# Patient Record
Sex: Male | Born: 1970 | Hispanic: No | Marital: Married | Smoking: Current some day smoker
Health system: Southern US, Community
[De-identification: ages and names within clinical notes are randomized; demographics above are authoritative.]

## PROBLEM LIST (undated history)

## (undated) DIAGNOSIS — B2790 Infectious mononucleosis, unspecified without complication: Secondary | ICD-10-CM

## (undated) DIAGNOSIS — T7840XA Allergy, unspecified, initial encounter: Secondary | ICD-10-CM

## (undated) DIAGNOSIS — I471 Supraventricular tachycardia: Secondary | ICD-10-CM

## (undated) DIAGNOSIS — K219 Gastro-esophageal reflux disease without esophagitis: Secondary | ICD-10-CM

## (undated) HISTORY — DX: Infectious mononucleosis, unspecified without complication: B27.90

## (undated) HISTORY — DX: Allergy, unspecified, initial encounter: T78.40XA

## (undated) HISTORY — PX: BACK SURGERY: SHX140

---
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
## 2006-05-16 ENCOUNTER — Ambulatory Visit: Payer: Self-pay | Admitting: Cardiology

## 2006-05-25 ENCOUNTER — Encounter: Payer: Self-pay | Admitting: Internal Medicine

## 2006-05-25 ENCOUNTER — Ambulatory Visit: Payer: Self-pay

## 2006-05-25 DIAGNOSIS — I471 Supraventricular tachycardia, unspecified: Secondary | ICD-10-CM

## 2006-05-25 HISTORY — DX: Supraventricular tachycardia, unspecified: I47.10

## 2006-05-25 HISTORY — DX: Supraventricular tachycardia: I47.1

## 2008-11-05 ENCOUNTER — Encounter: Payer: Self-pay | Admitting: Cardiology

## 2008-11-17 DIAGNOSIS — Z8619 Personal history of other infectious and parasitic diseases: Secondary | ICD-10-CM | POA: Insufficient documentation

## 2008-11-18 ENCOUNTER — Ambulatory Visit: Payer: Self-pay | Admitting: Cardiology

## 2010-08-16 NOTE — Assessment & Plan Note (Signed)
[facility] HEALTHCARE                            CARDIOLOGY OFFICE NOTE   Adam Crawford, Adam Crawford                        MRN:          [id number]  DATE:11/18/2008                            DOB:          03-31-1971    PRIMARY CARE PHYSICIAN:  Roney Jaffe   REASON FOR PRESENTATION:  Evaluate the patient for palpitations.   HISTORY OF PRESENT ILLNESS:  I saw this patient a couple of years ago  for evaluation of tachycardia.  It sounded like he was having  supraventricular arrhythmias.  He learned how to manage this over the  last couple of years.  He will rarely get some sustained  tachyarrhythmias.  However, he will rub his carotids and this will go  away.  He says that however in the past 2 weeks, he has noticed  increased palpitations that are unlike his previous.  These are isolated  beats.  He would feel them particularly when he was physically stressed  or emotionally stressed.  He did wear a Holter monitor.  This  demonstrated occasional premature ventricular contractions.  There were  no sustained dysrhythmias  though he did have some ventricular bigeminy,  0.7% of his total beats were ventricular premature beats.  He said that  since then, he has tried to eliminate some emotional stress.  He has  given some of his physical work away to [employer].  He says with this,  he is much improved.  In the last week, he has had very few  palpitations.  He denies any chest pressure, neck, or arm discomfort.  He denies any shortness of breath, PND or orthopnea.  He did see another  cardiologist in my absence who had suggested an echocardiogram.  However, he had one of these in 2008 that demonstrated no abnormalities.   PAST MEDICAL HISTORY:  Mononucleosis.   ALLERGIES:  None.   MEDICATIONS:  Vitamin D, weekly.   REVIEW OF SYSTEMS:  As stated in the HPI, otherwise negative for all  other systems.   PHYSICAL EXAMINATION:  GENERAL:  The patient is pleasant and  in no  distress.  VITAL SIGNS:  Blood pressure 126/80, heart rate 83 and regular, weight  199 pounds, body mass index 28.  HEENT:  Eyelids unremarkable.  Pupils equal, round, and reactive to  light.  Fundi not visualized.  Oral mucosa unremarkable.  NECK:  No jugular venous distention at 45 degrees, carotid upstroke  brisk and symmetric, no bruits, no thyromegaly.  LYMPHATICS:  No cervical, axillary, or inguinal adenopathy.  LUNGS:  Clear to auscultation bilaterally.  BACK:  No costovertebral angle tenderness.  CHEST:  Unremarkable.  HEART:  PMI not displaced or sustained, S1 and S2 within normal limits,  no S3, no S4, no clicks, no rubs, no murmurs.  ABDOMEN:  Flat, positive bowel sounds normal in frequency and pitch, no  bruits, rebound, guarding or midline pulsatile mass.  No hepatomegaly or splenomegaly.  SKIN:  No rashes, no nodules.  EXTREMITIES:  2+ pulses throughout, no edema, cyanosis, clubbing.  NEURO:  Oriented to person, place, and time.  Cranial  nerves II-XII  grossly intact, motor grossly intact.   ASSESSMENT/PLAN:  1. Premature ventricular contractions.  The patient is noticing these.      They are clearly related to emotional and physical stress.  He has      learnt how to control this.  He says they have been much less      prominent since he noted this association.  At this point, I do not      think further cardiovascular testing is indicated.  I would not      suggest an echocardiogram as there is no evidence that he has had      any structural change in his heart since his last echo 2 years ago.      He does not need a beta-blocker or calcium channel blocker.      However, we could discuss these treatments or antiarrhythmic if he      has any worsening symptoms.  2. Followup.  We will see the patient back as needed.     Rollene Rotunda, MD, [facility]  Electronically Signed    JH/MedQ  DD: 11/18/2008  DT: 11/19/2008  Job #: [id number]   cc:   Helene Kelp, PA

## 2010-08-19 NOTE — Assessment & Plan Note (Signed)
[facility] HEALTHCARE                            CARDIOLOGY OFFICE NOTE   Adam Crawford, Adam Crawford                        MRN:          [id number]  DATE:05/16/2006                            DOB:          1970-12-24    REASON FOR PRESENTATION:  Patient called and got this appointment for  evaluation of palpitations.  He has had these infrequently on and off  for years.  However, he has been having the much more frequently over  about 6 months.  He thinks it is about 6 times.  The last was greater  than 3 weeks ago.  He describes a sudden rapid heart rate.  It has  lasted up to about 30 minutes.  He has noticed in particular when he  bends over to pick something up.  He can lie down and have the symptoms  go away.  However, if he is in a situation where he cannot do it, they  may persist.  He gets a little lightheaded, but has not had any  presyncope or syncope.  He gets a little bit anxious about this.  He  does not describe any chest discomfort, neck discomfort, arm discomfort,  activity-induced nausea, vomiting or excessive diaphoresis.  He has not  been having any PND or orthopnea.  He, otherwise, is a very physically  active guy, and he cannot bring these symptoms on.   PAST MEDICAL HISTORY:  He had a history of mononucleosis, but absolutely  no other medical history.   PAST SURGICAL HISTORY:  None.   ALLERGIES:  NONE.   MEDICATIONS:  None.   SOCIAL HISTORY:  The patient is married.  He has a 53-month-old child.  He works as a Surveyor, minerals.  He rarely smokes cigarettes.  He will drink  alcohol occasionally.   FAMILY HISTORY:  Contributory for his father having a myocardial  infarction at age 16.  They were told this was coronary spasm.  He  denies any family history of sudden cardiac death, syncope or  cardiomyopathy.   REVIEW OF SYSTEMS:  As stated in the HPI, and otherwise negative for  other systems.   PHYSICAL EXAMINATION:  Patient is in no  distress.  Blood pressure 126/82.  Heart rate 63 and regular.  Weight 202 pounds.  HEENT:  Eyes unremarkable.  Pupils equal, round and reactive to light.  Fundi not visualized.  Oral mucosa unremarkable.  NECK:  No jugular venous distention at 45 degrees.  Carotid upstroke and  symmetric.  No bruits.  No thyromegaly.  LYMPHATICS:  No cervical, axillary or inguinal adenopathy.  LUNGS:  Clear to auscultation bilaterally.  BACK:  No costovertebral angle tenderness.  CHEST:  Unremarkable.  HEART:  PMI not displaced or sustained.  S1 and S2 within normal limits.  No S3.  No S4.  No clicks.  No rubs.  No murmurs.  ABDOMEN:  Flat.  Positive bowel sounds.  Normal in frequency and pitch.  No bruits.  No rebound.  No guarding.  No midline pulsatile mass.  No  hepatomegaly.  No splenomegaly.  SKIN:  No rashes.  No nodules.  EXTREMITIES:  Two plus pulses throughout.  No edema.  No cyanosis.  No  clubbing.  NEUROLOGIC:  Oriented to person, place and time.  Cranial nerves 2  through 12 grossly intact.  Motor grossly intact.   EKG:  Sinus rhythm, rate 63, axis within normal limits, short PR  interval, no acute ST-T wave changes.   ASSESSMENT AND PLAN:  1. Tachycardia.  Patient has what sounds like a supraventricular      tachycardia, probably a re-entrant loop.  He has now had this about      6 times in 6 months.  I have taught him vagal maneuvers.  He is      going to try these the next time this happens.  These are not      terribly symptomatic, though they have been worrisome to him.  If      he has episodes that continue to be symptomatic and do not respond      vagal maneuvers, we will consider an electrophysiology evaluation.      He knows that if he has any sustained episodes, I would like to      capture this on a monitor, and he will try to present for one of      these.  He knows to present to the emergency room should he have      any sustained episodes with presyncope or syncope.  I  am going to      get an echocardiogram.  He is also going to have a BMET, magnesium      and TSH.  I will take the liberty of ordering a lipid profile,      given his family history of early myocardial infarction.  2. Followup.  I am going to see him back in 3 months, or sooner if he      has any increasing symptoms.     Rollene Rotunda, MD, [facility]  Electronically Signed    JH/MedQ  DD: 05/16/2006  DT: 05/16/2006  Job #: [id number]

## 2010-09-25 ENCOUNTER — Emergency Department (HOSPITAL_COMMUNITY)
Admission: EM | Admit: 2010-09-25 | Discharge: 2010-09-25 | Disposition: A | Discharge status: Home / Self Care | Payer: 59 | Attending: Emergency Medicine | Admitting: Emergency Medicine

## 2010-09-25 DIAGNOSIS — M5430 Sciatica, unspecified side: Secondary | ICD-10-CM | POA: Insufficient documentation

## 2010-09-25 DIAGNOSIS — M545 Low back pain, unspecified: Secondary | ICD-10-CM | POA: Insufficient documentation

## 2010-09-25 DIAGNOSIS — IMO0001 Reserved for inherently not codable concepts without codable children: Secondary | ICD-10-CM | POA: Insufficient documentation

## 2010-09-25 DIAGNOSIS — M79609 Pain in unspecified limb: Secondary | ICD-10-CM | POA: Insufficient documentation

## 2010-09-28 ENCOUNTER — Other Ambulatory Visit: Payer: Self-pay | Admitting: Neurological Surgery

## 2010-09-28 DIAGNOSIS — M545 Low back pain, unspecified: Secondary | ICD-10-CM

## 2010-10-02 HISTORY — PX: LUMBAR MICRODISCECTOMY: SHX99

## 2010-10-03 ENCOUNTER — Ambulatory Visit
Admission: RE | Admit: 2010-10-03 | Discharge: 2010-10-03 | Disposition: A | Discharge status: Home / Self Care | Payer: 59 | Source: Ambulatory Visit | Attending: Neurological Surgery | Admitting: Neurological Surgery

## 2010-10-03 DIAGNOSIS — M545 Low back pain, unspecified: Secondary | ICD-10-CM

## 2010-10-03 MED ORDER — GADOBENATE DIMEGLUMINE 529 MG/ML IV SOLN
20.0000 mL | Freq: Once | INTRAVENOUS | Status: AC | PRN
  Administered 2010-10-03: 20 mL via INTRAVENOUS

## 2010-11-08 ENCOUNTER — Encounter: Payer: Self-pay | Admitting: Cardiology

## 2015-08-18 ENCOUNTER — Encounter (INDEPENDENT_AMBULATORY_CARE_PROVIDER_SITE_OTHER): Payer: Self-pay

## 2015-08-18 ENCOUNTER — Ambulatory Visit (INDEPENDENT_AMBULATORY_CARE_PROVIDER_SITE_OTHER): Payer: 59 | Admitting: Family Medicine

## 2015-08-18 ENCOUNTER — Encounter: Payer: Self-pay | Admitting: Family Medicine

## 2015-08-18 VITALS — BP 134/80 | HR 65 | Temp 99.6°F | Ht 72.0 in | Wt 206.4 lb

## 2015-08-18 DIAGNOSIS — J309 Allergic rhinitis, unspecified: Secondary | ICD-10-CM | POA: Diagnosis not present

## 2015-08-18 MED ORDER — FLUTICASONE PROPIONATE 50 MCG/ACT NA SUSP: 1.0000 | Freq: Two times a day (BID) | NASAL | Status: DC | PRN

## 2015-08-18 NOTE — Progress Notes (Signed)
BP 134/80 mmHg  Pulse 65  Temp(Src) 99.6 F (37.6 C) (Oral)  Ht 6' (1.829 m)  Wt 206 lb 6.4 oz (93.622 kg)  BMI 27.99 kg/m2   Subjective:    Patient ID: Adam Crawford, male    DOB: [birth year], 45 y.o.   MRN: [id number]  HPI: Adam Crawford is a 45 y.o. male presenting on 08/18/2015 for Sinusitis   HPI Sinus congestion and cough and postnasal drainage and low-grade temperature Patient has been having sinus congestion and cough and a low-grade temperature in the 99 range. He is also been having postnasal drainage and popping and pressure in his ear intermittently. This is been going on for about 6 days and he has just started Allegra-D over the past 2 or 3 days. He feels like it's helping some but he has a lot of congestion and feels like he is still stuffed up. He denies any shortness of breath or wheezing or chills.  Relevant past medical, surgical, family and social history reviewed and updated as indicated. Interim medical history since our last visit reviewed. Allergies and medications reviewed and updated.  Review of Systems  Constitutional: Positive for fever. Negative for chills.  HENT: Positive for congestion, ear pain, postnasal drip, rhinorrhea, sinus pressure, sneezing and sore throat. Negative for ear discharge and voice change.   Eyes: Negative for pain, discharge, redness and visual disturbance.  Respiratory: Positive for cough. Negative for chest tightness, shortness of breath and wheezing.   Cardiovascular: Negative for chest pain and leg swelling.  Gastrointestinal: Negative for abdominal pain, diarrhea and constipation.  Genitourinary: Negative for difficulty urinating.  Musculoskeletal: Negative for back pain and gait problem.  Skin: Negative for rash.  Neurological: Negative for syncope, light-headedness and headaches.  All other systems reviewed and are negative.  Social History   Social History  . Marital Status: Married    Spouse Name: N/A  . Number of  Children: 1  . Years of Education: N/A   Occupational History  . Contractor    Social History Main Topics  . Smoking status: Passive Smoke Exposure - Never Smoker  . Smokeless tobacco: Not on file  . Alcohol Use: Yes     Comment: occasionally  . Drug Use: Not on file  . Sexual Activity: Not on file   Other Topics Concern  . Not on file   Social History Narrative   He has a 86-month-old child.   Past Medical History  Diagnosis Date  . Mononucleosis   . Allergy     Past Surgical History  Procedure Laterality Date  . Lumbar microdiscectomy  July 2012    Per HPI unless specifically indicated above     Medication List       This list is accurate as of: 08/18/15 11:45 AM.  Always use your most recent med list.               ALLEGRA-D 24 HOUR PO  Take 1 tablet by mouth daily.     fluticasone 50 MCG/ACT nasal spray  Commonly known as:  FLONASE  Place 1 spray into both nostrils 2 (two) times daily as needed for allergies or rhinitis.     ibuprofen 400 MG tablet  Commonly known as:  ADVIL,MOTRIN  Take 400 mg by mouth every 6 (six) hours as needed.           Objective:    BP 134/80 mmHg  Pulse 65  Temp(Src) 99.6 F (37.6 C) (Oral)  Ht 6' (1.829 m)  Wt 206 lb 6.4 oz (93.622 kg)  BMI 27.99 kg/m2  Wt Readings from Last 3 Encounters:  08/18/15 206 lb 6.4 oz (93.622 kg)    Physical Exam  Constitutional: He is oriented to person, place, and time. He appears well-developed and well-nourished. No distress.  HENT:  Right Ear: Tympanic membrane, external ear and ear canal normal.  Left Ear: Tympanic membrane, external ear and ear canal normal.  Nose: Mucosal edema and rhinorrhea present. No sinus tenderness. No epistaxis. Right sinus exhibits maxillary sinus tenderness. Right sinus exhibits no frontal sinus tenderness. Left sinus exhibits maxillary sinus tenderness. Left sinus exhibits no frontal sinus tenderness.  Mouth/Throat: Uvula is midline and mucous  membranes are normal. Posterior oropharyngeal edema and posterior oropharyngeal erythema present. No oropharyngeal exudate or tonsillar abscesses.  Eyes: Conjunctivae and EOM are normal. Pupils are equal, round, and reactive to light. Right eye exhibits no discharge. No scleral icterus.  Neck: Neck supple. No thyromegaly present.  Cardiovascular: Normal rate, regular rhythm, normal heart sounds and intact distal pulses.   No murmur heard. Pulmonary/Chest: Effort normal and breath sounds normal. No respiratory distress. He has no wheezes. He has no rales.  Musculoskeletal: Normal range of motion. He exhibits no edema.  Lymphadenopathy:    He has no cervical adenopathy.  Neurological: He is alert and oriented to person, place, and time. Coordination normal.  Skin: Skin is warm and dry. No rash noted. He is not diaphoretic.  Psychiatric: He has a normal mood and affect. His behavior is normal.  Nursing note and vitals reviewed.   No results found for this or any previous visit.    Assessment & Plan:   Problem List Items Addressed This Visit    None    Visit Diagnoses    Allergic rhinitis, unspecified allergic rhinitis type    -  Primary    Re: Taking Allegra-D, recommended Mucinex and Flonase and nasal saline, if not improved in 3 or 4 days and give us call.    Relevant Medications    fluticasone (FLONASE) 50 MCG/ACT nasal spray        Follow up plan: Return if symptoms worsen or fail to improve, for Come back for a physical and screening labs.  Counseling provided for all of the vaccine components No orders of the defined types were placed in this encounter.    Arville CareJoshua Dettinger, MD [facility] 08/18/2015, 11:45 AM

## 2015-08-25 ENCOUNTER — Encounter: Payer: 59 | Admitting: Family Medicine

## 2015-11-23 ENCOUNTER — Telehealth: Payer: Self-pay | Admitting: Family Medicine

## 2017-09-25 ENCOUNTER — Ambulatory Visit (INDEPENDENT_AMBULATORY_CARE_PROVIDER_SITE_OTHER): Payer: 59 | Admitting: Family Medicine

## 2017-09-25 ENCOUNTER — Encounter: Payer: Self-pay | Admitting: Family Medicine

## 2017-09-25 VITALS — BP 137/85 | HR 89 | Temp 100.0°F | Ht 72.0 in | Wt 214.0 lb

## 2017-09-25 DIAGNOSIS — J0111 Acute recurrent frontal sinusitis: Secondary | ICD-10-CM

## 2017-09-25 DIAGNOSIS — H6123 Impacted cerumen, bilateral: Secondary | ICD-10-CM | POA: Diagnosis not present

## 2017-09-25 MED ORDER — AZITHROMYCIN 250 MG PO TABS: ORAL_TABLET | ORAL | 0 refills | Status: DC

## 2017-09-25 MED ORDER — PREDNISONE 20 MG PO TABS: ORAL_TABLET | ORAL | 0 refills | Status: DC

## 2017-09-25 NOTE — Progress Notes (Signed)
BP 137/85   Pulse 89   Temp 100 F (37.8 C) (Oral)   Ht 6' (1.829 m)   Wt 214 lb (97.1 kg)   BMI 29.02 kg/m    Subjective:    Patient ID: Adam Crawford, male    DOB: [birth year], 47 y.o.   MRN: [id number]   HPI Patient is a 47yr old male with 6 month history of sinus congestion. He feels "a lot of pressure in his head, sinuses and ears." He describes a long history of seasonal allergies, but says Mucinex, Xyzal and Flonase haven't helped like they normally do. It worsens after being outside and working in [employer]. He denies fever, chills, sore throat, cough, SOB or drainage.  He has not been sick recently and has had no sick contacts. He was treated for similar symptoms last Fall with an antibiotic and steroids, which helped, but he states that "it came right back."  Relevant past medical, surgical, family and social history reviewed and updated as indicated. Interim medical history since our last visit reviewed. Allergies and medications reviewed and updated.  Review of Systems  Constitutional: Negative for chills, fatigue and fever.  HENT: Positive for congestion and sinus pressure. Negative for postnasal drip, rhinorrhea, sneezing and sore throat.        Congestion, sinus pressure, pressure in bilateral ears "like they need to pop."  Eyes: Negative for redness and itching.  Respiratory: Negative for cough, shortness of breath and wheezing.   Cardiovascular: Negative for chest pain.  Skin: Negative for rash.  Allergic/Immunologic: Positive for environmental allergies.  Neurological: Negative for dizziness and headaches.   Per HPI unless specifically indicated above   Allergies as of 09/25/2017   No Known Allergies     Medication List        Accurate as of 09/25/17  4:13 PM. Always use your most recent med list.          ALLEGRA-D 24 HOUR PO Take 1 tablet by mouth daily.   fluticasone 50 MCG/ACT nasal spray Commonly known as:  FLONASE Place 1 spray into both  nostrils 2 (two) times daily as needed for allergies or rhinitis.   ibuprofen 400 MG tablet Commonly known as:  ADVIL,MOTRIN Take 400 mg by mouth every 6 (six) hours as needed.   levocetirizine 5 MG tablet Commonly known as:  XYZAL Take 5 mg by mouth every evening.          Objective:    BP 137/85   Pulse 89   Temp 100 F (37.8 C) (Oral)   Ht 6' (1.829 m)   Wt 214 lb (97.1 kg)   BMI 29.02 kg/m   Wt Readings from Last 3 Encounters:  09/25/17 214 lb (97.1 kg)  08/18/15 206 lb 6.4 oz (93.6 kg)    Physical Exam  Constitutional: He is oriented to person, place, and time. He appears well-developed and well-nourished. No distress.  HENT:  Right Ear: Tympanic membrane normal.  Left Ear: Tympanic membrane normal.  Nose: Mucosal edema present. Right sinus exhibits no maxillary sinus tenderness and no frontal sinus tenderness. Left sinus exhibits no maxillary sinus tenderness and no frontal sinus tenderness.  Mouth/Throat: No posterior oropharyngeal erythema.  TMs appeared normal after bilateral ear lavage & left ear curettage for removal of excessive cerumen.  Eyes: Conjunctivae are normal.  Cardiovascular: Normal rate, regular rhythm, normal heart sounds and intact distal pulses.  Pulmonary/Chest: Effort normal and breath sounds normal. He has no wheezes.  Lymphadenopathy:  He has no cervical adenopathy.  Neurological: He is alert and oriented to person, place, and time.  Skin: Skin is warm and dry.    No results found for this or any previous visit.    Assessment & Plan:  Patient presents to the clinic with 6 month history of sinus congestion. Patient's history and physical exam findings are consistent with recurrent sinusitis.  Nurse performed bilateral ear lavage and right ear curettage for excessive cerumen. Patient tolerated the procedure well and some cerumen was removed.  I have prescribed a z-pack and prednisone taper for symptomatic relief. I also instructed the  patient to continue taking his Xyzal in the morning and Flonase at night. I encouraged him to use his humidifier at home for additional relief.    Problem List Items Addressed This Visit    None    Visit Diagnoses    Acute recurrent frontal sinusitis    -  Primary   Relevant Medications   levocetirizine (XYZAL) 5 MG tablet   azithromycin (ZITHROMAX) 250 MG tablet   predniSONE (DELTASONE) 20 MG tablet   Bilateral impacted cerumen       Relevant Medications   predniSONE (DELTASONE) 20 MG tablet       Follow up plan: I advised the patient to contact our office should his symptoms worsen, not improve, or return after he finishes the antibiotic and steroid. At that time, we will discuss possible referral to ENT specialist.  Liana Crockerebecca Mitchell, PA-S   Counseling provided for all of the vaccine components No orders of the defined types were placed in this encounter.  Patient seen and examined with Liana Crockerebecca Mitchell, PA student, agree with assessment and plan above. Arville CareJoshua Varick Keys, MD [facility] 09/25/2017, 4:13 PM

## 2018-05-24 ENCOUNTER — Other Ambulatory Visit: Payer: Self-pay | Admitting: Otolaryngology

## 2018-05-24 DIAGNOSIS — J329 Chronic sinusitis, unspecified: Secondary | ICD-10-CM

## 2018-06-21 ENCOUNTER — Other Ambulatory Visit: Payer: Self-pay

## 2018-07-03 ENCOUNTER — Other Ambulatory Visit: Payer: Self-pay

## 2018-07-03 ENCOUNTER — Ambulatory Visit
Admission: RE | Admit: 2018-07-03 | Discharge: 2018-07-03 | Disposition: A | Discharge status: Home / Self Care | Payer: 59 | Source: Ambulatory Visit | Attending: Otolaryngology | Admitting: Otolaryngology

## 2018-07-03 DIAGNOSIS — J329 Chronic sinusitis, unspecified: Secondary | ICD-10-CM

## 2018-10-15 ENCOUNTER — Other Ambulatory Visit: Payer: Self-pay

## 2018-10-15 ENCOUNTER — Encounter (HOSPITAL_BASED_OUTPATIENT_CLINIC_OR_DEPARTMENT_OTHER): Payer: Self-pay | Admitting: *Deleted

## 2018-10-18 ENCOUNTER — Other Ambulatory Visit (HOSPITAL_COMMUNITY)
Admission: RE | Admit: 2018-10-18 | Discharge: 2018-10-18 | Disposition: A | Discharge status: Home / Self Care | Payer: 59 | Source: Ambulatory Visit | Attending: Otolaryngology | Admitting: Otolaryngology

## 2018-10-18 DIAGNOSIS — Z1159 Encounter for screening for other viral diseases: Secondary | ICD-10-CM | POA: Diagnosis present

## 2018-10-18 LAB — SARS CORONAVIRUS 2 (TAT 6-24 HRS): SARS Coronavirus 2: NEGATIVE

## 2018-10-22 ENCOUNTER — Ambulatory Visit (HOSPITAL_BASED_OUTPATIENT_CLINIC_OR_DEPARTMENT_OTHER): Payer: 59 | Admitting: Anesthesiology

## 2018-10-22 ENCOUNTER — Encounter (HOSPITAL_BASED_OUTPATIENT_CLINIC_OR_DEPARTMENT_OTHER): Payer: Self-pay | Admitting: *Deleted

## 2018-10-22 ENCOUNTER — Encounter (HOSPITAL_BASED_OUTPATIENT_CLINIC_OR_DEPARTMENT_OTHER)
Admission: RE | Disposition: A | Discharge status: Home / Self Care | Payer: Self-pay | Source: Home / Self Care | Attending: Otolaryngology

## 2018-10-22 ENCOUNTER — Other Ambulatory Visit: Payer: Self-pay

## 2018-10-22 ENCOUNTER — Ambulatory Visit (HOSPITAL_BASED_OUTPATIENT_CLINIC_OR_DEPARTMENT_OTHER)
Admission: RE | Admit: 2018-10-22 | Discharge: 2018-10-22 | Disposition: A | Discharge status: Home / Self Care | Payer: 59 | Attending: Otolaryngology | Admitting: Otolaryngology

## 2018-10-22 DIAGNOSIS — J3489 Other specified disorders of nose and nasal sinuses: Secondary | ICD-10-CM | POA: Insufficient documentation

## 2018-10-22 DIAGNOSIS — J320 Chronic maxillary sinusitis: Secondary | ICD-10-CM | POA: Diagnosis not present

## 2018-10-22 DIAGNOSIS — J322 Chronic ethmoidal sinusitis: Secondary | ICD-10-CM | POA: Diagnosis not present

## 2018-10-22 DIAGNOSIS — J343 Hypertrophy of nasal turbinates: Secondary | ICD-10-CM | POA: Diagnosis not present

## 2018-10-22 DIAGNOSIS — F17200 Nicotine dependence, unspecified, uncomplicated: Secondary | ICD-10-CM | POA: Insufficient documentation

## 2018-10-22 DIAGNOSIS — J342 Deviated nasal septum: Secondary | ICD-10-CM | POA: Diagnosis not present

## 2018-10-22 HISTORY — PX: SEPTOPLASTY WITH ETHMOIDECTOMY, AND MAXILLARY ANTROSTOMY: SHX6090

## 2018-10-22 HISTORY — PX: SINUS ENDO WITH FUSION: SHX5329

## 2018-10-22 HISTORY — DX: Gastro-esophageal reflux disease without esophagitis: K21.9

## 2018-10-22 SURGERY — FESS, WITH MAXILLARY ANTROSTOMY, SEPTOPLASTY, AND ETHMOIDECTOMY
Anesthesia: General | Site: Nose | Laterality: Bilateral

## 2018-10-22 MED ORDER — ONDANSETRON HCL 4 MG/2ML IJ SOLN
INTRAMUSCULAR | Status: DC | PRN
  Administered 2018-10-22: 4 mg via INTRAVENOUS

## 2018-10-22 MED ORDER — MUPIROCIN 2 % EX OINT
TOPICAL_OINTMENT | CUTANEOUS | Status: DC | PRN
  Administered 2018-10-22: 1 via TOPICAL

## 2018-10-22 MED ORDER — MIDAZOLAM HCL 2 MG/2ML IJ SOLN
1.0000 mg | INTRAMUSCULAR | Status: DC | PRN
  Administered 2018-10-22: 2 mg via INTRAVENOUS

## 2018-10-22 MED ORDER — SCOPOLAMINE 1 MG/3DAYS TD PT72: 1.0000 | MEDICATED_PATCH | Freq: Once | TRANSDERMAL | Status: DC

## 2018-10-22 MED ORDER — LACTATED RINGERS IV SOLN
INTRAVENOUS | Status: DC
  Administered 2018-10-22: 09:00:00 via INTRAVENOUS

## 2018-10-22 MED ORDER — LIDOCAINE 2% (20 MG/ML) 5 ML SYRINGE
INTRAMUSCULAR | Status: DC | PRN
  Administered 2018-10-22: 60 mg via INTRAVENOUS

## 2018-10-22 MED ORDER — SODIUM CHLORIDE 0.9 % IR SOLN
Status: DC | PRN
  Administered 2018-10-22: 100 mL

## 2018-10-22 MED ORDER — CEFAZOLIN SODIUM 1 G IJ SOLR
INTRAMUSCULAR | Status: AC
  Filled 2018-10-22: qty 20

## 2018-10-22 MED ORDER — SUCCINYLCHOLINE CHLORIDE 20 MG/ML IJ SOLN
INTRAMUSCULAR | Status: DC | PRN
  Administered 2018-10-22: 120 mg via INTRAVENOUS

## 2018-10-22 MED ORDER — DEXAMETHASONE SODIUM PHOSPHATE 10 MG/ML IJ SOLN
INTRAMUSCULAR | Status: AC
  Filled 2018-10-22: qty 1

## 2018-10-22 MED ORDER — FENTANYL CITRATE (PF) 100 MCG/2ML IJ SOLN
INTRAMUSCULAR | Status: AC
  Filled 2018-10-22: qty 2

## 2018-10-22 MED ORDER — LIDOCAINE 2% (20 MG/ML) 5 ML SYRINGE
INTRAMUSCULAR | Status: AC
  Filled 2018-10-22: qty 5

## 2018-10-22 MED ORDER — LIDOCAINE-EPINEPHRINE 1 %-1:100000 IJ SOLN
INTRAMUSCULAR | Status: DC | PRN
  Administered 2018-10-22: 2.5 mL

## 2018-10-22 MED ORDER — OXYCODONE-ACETAMINOPHEN 5-325 MG PO TABS: 1.0000 | ORAL_TABLET | ORAL | 0 refills | Status: AC | PRN

## 2018-10-22 MED ORDER — MIDAZOLAM HCL 2 MG/2ML IJ SOLN
INTRAMUSCULAR | Status: AC
  Filled 2018-10-22: qty 2

## 2018-10-22 MED ORDER — FENTANYL CITRATE (PF) 100 MCG/2ML IJ SOLN
50.0000 ug | INTRAMUSCULAR | Status: AC | PRN
  Administered 2018-10-22: 50 ug via INTRAVENOUS
  Administered 2018-10-22: 100 ug via INTRAVENOUS
  Administered 2018-10-22: 50 ug via INTRAVENOUS

## 2018-10-22 MED ORDER — SUCCINYLCHOLINE CHLORIDE 200 MG/10ML IV SOSY
PREFILLED_SYRINGE | INTRAVENOUS | Status: AC
  Filled 2018-10-22: qty 10

## 2018-10-22 MED ORDER — DEXAMETHASONE SODIUM PHOSPHATE 4 MG/ML IJ SOLN
INTRAMUSCULAR | Status: DC | PRN
  Administered 2018-10-22: 10 mg via INTRAVENOUS

## 2018-10-22 MED ORDER — SUGAMMADEX SODIUM 200 MG/2ML IV SOLN
INTRAVENOUS | Status: DC | PRN
  Administered 2018-10-22: 200 mg via INTRAVENOUS

## 2018-10-22 MED ORDER — AMOXICILLIN 875 MG PO TABS: 875.0000 mg | ORAL_TABLET | Freq: Two times a day (BID) | ORAL | 0 refills | Status: AC

## 2018-10-22 MED ORDER — ROCURONIUM BROMIDE 10 MG/ML (PF) SYRINGE
PREFILLED_SYRINGE | INTRAVENOUS | Status: DC | PRN
  Administered 2018-10-22: 50 mg via INTRAVENOUS

## 2018-10-22 MED ORDER — OXYMETAZOLINE HCL 0.05 % NA SOLN
NASAL | Status: DC | PRN
  Administered 2018-10-22: 1 via TOPICAL

## 2018-10-22 MED ORDER — ONDANSETRON HCL 4 MG/2ML IJ SOLN
INTRAMUSCULAR | Status: AC
  Filled 2018-10-22: qty 2

## 2018-10-22 MED ORDER — CEFAZOLIN SODIUM-DEXTROSE 2-3 GM-%(50ML) IV SOLR
INTRAVENOUS | Status: DC | PRN
  Administered 2018-10-22: 2 g via INTRAVENOUS

## 2018-10-22 MED ORDER — PROPOFOL 10 MG/ML IV BOLUS
INTRAVENOUS | Status: DC | PRN
  Administered 2018-10-22: 200 mg via INTRAVENOUS

## 2018-10-22 MED ORDER — HYDROMORPHONE HCL 1 MG/ML IJ SOLN
0.2500 mg | INTRAMUSCULAR | Status: DC | PRN
  Administered 2018-10-22: 0.5 mg via INTRAVENOUS

## 2018-10-22 MED ORDER — HYDROMORPHONE HCL 1 MG/ML IJ SOLN
INTRAMUSCULAR | Status: AC
  Filled 2018-10-22: qty 0.5

## 2018-10-22 SURGICAL SUPPLY — 55 items
ATTRACTOMAT 16X20 MAGNETIC DRP (DRAPES) IMPLANT
BLADE ROTATE RAD 12 4 M4 (BLADE) IMPLANT
BLADE ROTATE RAD 40 4 M4 (BLADE) IMPLANT
BLADE ROTATE TRICUT 4X13 M4 (BLADE) ×2 IMPLANT
BLADE TRICUT ROTATE M4 4 5PK (BLADE) IMPLANT
BUR HS RAD FRONTAL 3 (BURR) IMPLANT
CANISTER SUC SOCK COL 7IN (MISCELLANEOUS) ×3 IMPLANT
CANISTER SUCT 1200ML W/VALVE (MISCELLANEOUS) ×2 IMPLANT
COAGULATOR SUCT 8FR VV (MISCELLANEOUS) ×2 IMPLANT
COVER WAND RF STERILE (DRAPES) IMPLANT
DECANTER SPIKE VIAL GLASS SM (MISCELLANEOUS) IMPLANT
DRSG NASAL KENNEDY LMNT 8CM (GAUZE/BANDAGES/DRESSINGS) IMPLANT
DRSG NASOPORE 8CM (GAUZE/BANDAGES/DRESSINGS) ×1 IMPLANT
DRSG TELFA 3X8 NADH (GAUZE/BANDAGES/DRESSINGS) IMPLANT
ELECT REM PT RETURN 9FT ADLT (ELECTROSURGICAL) ×2
ELECTRODE REM PT RTRN 9FT ADLT (ELECTROSURGICAL) ×1 IMPLANT
GLOVE BIO SURGEON STRL SZ7.5 (GLOVE) ×2 IMPLANT
GLOVE BIOGEL PI IND STRL 7.0 (GLOVE) IMPLANT
GLOVE BIOGEL PI INDICATOR 7.0 (GLOVE) ×2
GLOVE ECLIPSE 6.5 STRL STRAW (GLOVE) ×2 IMPLANT
GOWN STRL REUS W/ TWL LRG LVL3 (GOWN DISPOSABLE) ×2 IMPLANT
GOWN STRL REUS W/TWL LRG LVL3 (GOWN DISPOSABLE) ×6
HEMOSTAT SURGICEL 2X14 (HEMOSTASIS) IMPLANT
IV NS 1000ML (IV SOLUTION)
IV NS 1000ML BAXH (IV SOLUTION) IMPLANT
IV NS 500ML (IV SOLUTION) ×2
IV NS 500ML BAXH (IV SOLUTION) ×2 IMPLANT
NDL HYPO 25X1 1.5 SAFETY (NEEDLE) ×1 IMPLANT
NDL SPNL 25GX3.5 QUINCKE BL (NEEDLE) IMPLANT
NEEDLE HYPO 25X1 1.5 SAFETY (NEEDLE) ×2 IMPLANT
NEEDLE SPNL 25GX3.5 QUINCKE BL (NEEDLE) IMPLANT
NS IRRIG 1000ML POUR BTL (IV SOLUTION) ×2 IMPLANT
PACK BASIN DAY SURGERY FS (CUSTOM PROCEDURE TRAY) ×2 IMPLANT
PACK ENT DAY SURGERY (CUSTOM PROCEDURE TRAY) ×2 IMPLANT
PAD DRESSING TELFA 3X8 NADH (GAUZE/BANDAGES/DRESSINGS) IMPLANT
SLEEVE SCD COMPRESS KNEE MED (MISCELLANEOUS) ×1 IMPLANT
SOLUTION BUTLER CLEAR DIP (MISCELLANEOUS) ×2 IMPLANT
SPLINT NASAL AIRWAY SILICONE (MISCELLANEOUS) ×1 IMPLANT
SPONGE GAUZE 2X2 8PLY STRL LF (GAUZE/BANDAGES/DRESSINGS) ×2 IMPLANT
SPONGE NEURO XRAY DETECT 1X3 (DISPOSABLE) ×2 IMPLANT
SUCTION FRAZIER HANDLE 10FR (MISCELLANEOUS)
SUCTION TUBE FRAZIER 10FR DISP (MISCELLANEOUS) IMPLANT
SUT CHROMIC 4 0 P 3 18 (SUTURE) ×2 IMPLANT
SUT PLAIN 4 0 ~~LOC~~ 1 (SUTURE) ×2 IMPLANT
SUT PROLENE 3 0 PS 2 (SUTURE) ×1 IMPLANT
SUT VIC AB 4-0 P-3 18XBRD (SUTURE) IMPLANT
SUT VIC AB 4-0 P3 18 (SUTURE)
SYR 50ML LL SCALE MARK (SYRINGE) ×1 IMPLANT
TOWEL GREEN STERILE FF (TOWEL DISPOSABLE) ×2 IMPLANT
TRACKER ENT INSTRUMENT (MISCELLANEOUS) ×2 IMPLANT
TRACKER ENT PATIENT (MISCELLANEOUS) ×2 IMPLANT
TUBE CONNECTING 20X1/4 (TUBING) ×2 IMPLANT
TUBE SALEM SUMP 16 FR W/ARV (TUBING) ×2 IMPLANT
TUBING STRAIGHTSHOT EPS 5PK (TUBING) ×2 IMPLANT
YANKAUER SUCT BULB TIP NO VENT (SUCTIONS) ×2 IMPLANT

## 2018-10-22 NOTE — Anesthesia Preprocedure Evaluation (Addendum)
Anesthesia Evaluation  Patient identified by MRN, date of birth, ID band Patient awake    Reviewed: Allergy & Precautions, NPO status , Patient's Chart, lab work & pertinent test results  Airway Mallampati: II  TM Distance: >3 FB     Dental   Pulmonary Current Smoker,    breath sounds clear to auscultation       Cardiovascular negative cardio ROS   Rhythm:Regular Rate:Normal  Cardiac work up noted. CG   Neuro/Psych    GI/Hepatic Neg liver ROS, GERD  ,  Endo/Other  negative endocrine ROS  Renal/GU negative Renal ROS     Musculoskeletal   Abdominal   Peds  Hematology   Anesthesia Other Findings   Reproductive/Obstetrics                            Anesthesia Physical Anesthesia Plan  ASA: II  Anesthesia Plan: General   Post-op Pain Management:    Induction: Intravenous  PONV Risk Score and Plan: 1 and Ondansetron, Dexamethasone and Midazolam  Airway Management Planned: Oral ETT  Additional Equipment:   Intra-op Plan:   Post-operative Plan: Extubation in OR  Informed Consent: I have reviewed the patients History and Physical, chart, labs and discussed the procedure including the risks, benefits and alternatives for the proposed anesthesia with the patient or authorized representative who has indicated his/her understanding and acceptance.       Plan Discussed with: CRNA, Anesthesiologist and Surgeon  Anesthesia Plan Comments:         Anesthesia Quick Evaluation

## 2018-10-22 NOTE — Transfer of Care (Signed)
Immediate Anesthesia Transfer of Care Note  Patient: Adam Crawford  Procedure(s) Performed: ENDOSCOPIC SEPTOPLASTY WITH BILATERAL TURBINATE REDUCTION, ETHMOIDECTOMY AND MAXILLARY ANTROSTOMY (Bilateral Nose) SINUS ENDOSCOPY WITH FUSION NAVIGATION (Bilateral Nose)  Patient Location: PACU  Anesthesia Type:General  Level of Consciousness: awake, sedated and patient cooperative  Airway & Oxygen Therapy: Patient Spontanous Breathing and Patient connected to face mask oxygen  Post-op Assessment: Report given to RN and Post -op Vital signs reviewed and stable  Post vital signs: Reviewed and stable  Last Vitals:  Vitals Value Taken Time  BP 162/104 10/22/18 1149  Temp    Pulse 83 10/22/18 1150  Resp 11 10/22/18 1150  SpO2 100 % 10/22/18 1150  Vitals shown include unvalidated device data.  Last Pain:  Vitals:   10/22/18 0909  TempSrc: Oral  PainSc: 0-No pain      Patients Stated Pain Goal: 3 (25/05/39 7673)  Complications: No apparent anesthesia complications

## 2018-10-22 NOTE — Anesthesia Postprocedure Evaluation (Signed)
Anesthesia Post Note  Patient: Adam Crawford  Procedure(s) Performed: ENDOSCOPIC SEPTOPLASTY WITH BILATERAL TURBINATE REDUCTION, ETHMOIDECTOMY AND MAXILLARY ANTROSTOMY (Bilateral Nose) SINUS ENDOSCOPY WITH FUSION NAVIGATION (Bilateral Nose)     Patient location during evaluation: PACU Anesthesia Type: General Level of consciousness: awake and alert Pain management: pain level controlled Vital Signs Assessment: post-procedure vital signs reviewed and stable Respiratory status: spontaneous breathing, nonlabored ventilation, respiratory function stable and patient connected to nasal cannula oxygen Cardiovascular status: blood pressure returned to baseline and stable Postop Assessment: no apparent nausea or vomiting Anesthetic complications: no    Last Vitals:  Vitals:   10/22/18 1230 10/22/18 1259  BP: (!) 147/95 (!) 173/88  Pulse: 80 82  Resp: 15 18  Temp:  36.7 C  SpO2: 99% 98%    Last Pain:  Vitals:   10/22/18 1259  TempSrc:   PainSc: 0-No pain                 Ryan P Ellender

## 2018-10-22 NOTE — Op Note (Signed)
DATE OF PROCEDURE: 10/22/2018  OPERATIVE REPORT   SURGEON: Leta Baptist, MD   PREOPERATIVE DIAGNOSES:  1. Severe nasal septal deviation.  2. Bilateral inferior turbinate hypertrophy.  3. Chronic nasal obstruction. 4. Bilateral chronic maxillary and ethmoid sinusitis.  POSTOPERATIVE DIAGNOSES:  1. Severe nasal septal deviation.  2. Bilateral inferior turbinate hypertrophy.  3. Chronic nasal obstruction. 4. Bilateral chronic maxillary and ethmoid sinusitis.  PROCEDURE PERFORMED:  1. Bilateral endoscopic total ethmoidectomy. 2. Bilateral endoscopic maxillary antrostomy with tissue removal. 3. Septoplasty.  4. Bilateral partial inferior turbinate resection.  5. FUSION stereotactic image guidance.  ANESTHESIA: General endotracheal tube anesthesia.   COMPLICATIONS: None.   ESTIMATED BLOOD LOSS: 300 mL.   INDICATION FOR PROCEDURE: Adam Crawford is a 48 y.o. male with a history of chronic nasal obstruction. The patient was previously treated with antihistamine, decongestant, steroid nasal spray, and systemic steroids. However, the patient continued to be symptomatic. On examination, the patient was noted to have bilateral severe inferior turbinate hypertrophy and significant nasal septal deviation, causing significant nasal obstruction.  His CT scan also showed mucosal diseases in the maxillary and ethmoid sinuses bilaterally.  Based on the above findings, the decision was made for the patient to undergo the above-stated procedures. The risks, benefits, alternatives, and details of the procedures were discussed with the patient. Questions were invited and answered. Informed consent was obtained.   DESCRIPTION OF PROCEDURE: The patient was taken to the operating room and placed supine on the operating table. General endotracheal tube anesthesia was administered by the anesthesiologist. The patient was positioned, and prepped and draped in the standard fashion for nasal surgery. Pledgets  soaked with Afrin were placed in both nasal cavities for decongestion. The pledgets were subsequently removed. The FUSION stereotactic image guidance marker was placed. The image guidance system was functional throughout the case.  Examination of the nasal cavity revealed a severe nasal septal deviationd. 1% lidocaine with 1:100,000 epinephrine was injected onto the nasal septum bilaterally. A hemitransfixion incision was made on the left side. The mucosal flap was carefully elevated on the left side. A cartilaginous incision was made 1 cm superior to the caudal margin of the nasal septum. Mucosal flap was also elevated on the right side in the similar fashion. It should be noted that due to the severe septal deviation, the deviated portion of the cartilaginous and bony septum had to be removed in piecemeal fashion. Once the deviated portions were removed, a straight midline septum was achieved. The septum was then quilted with 4-0 plain gut sutures. The hemitransfixion incision was closed with interrupted 4-0 chromic sutures.   The inferior one half of both hypertrophied inferior turbinate was crossclamped with a Kelly clamp. The inferior one half of each inferior turbinate was then resected with a pair of cross cutting scissors. Hemostasis was achieved with a suction cautery device.   Using a 0 endoscope, the left nasal cavity was examined.  The middle turbinate was carefully medialized.  Polypoid tissue was noted within the middle meatus. The polypoid tissue was removed using a combination of microdebrider and Blakesley forceps. The uncinate process was resected with a freer elevator. The maxillary antrum was entered and enlarged using a combination of backbiter and microdebrider. Polypoid tissue was removed from the left maxillary sinus.  Attention was then focused on the ethmoid sinuses. The bony partitions of the anterior and posterior ethmoid cavities were taken down. Polypoid tissue was noted and  removed. The sinuses were copiously irrigated with saline solution.  The same procedure was repeated on the right side without exception. More polyps were noted on the right side, and were removed. Doyle splints were applied to the nasal septum.  The care of the patient was turned over to the anesthesiologist. The patient was awakened from anesthesia without difficulty. The patient was extubated and transferred to the recovery room in good condition.   OPERATIVE FINDINGS: Severe nasal septal deviation and bilateral inferior turbinate hypertrophy.  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  SPECIMEN: Bilateral sinus contents.  FOLLOWUP CARE: The patient be discharged home once he is awake and alert. The patient will be placed on Percocet 1 tablets p.o. q.4 hours p.r.n. pain, and amoxicillin 875 mg p.o. b.i.d. for 5 days. The patient will follow up in my office in approximately 3 days for splint removal.   Zara Wendt Philomena DohenyWooi Alexande Sheerin, MD

## 2018-10-22 NOTE — Anesthesia Procedure Notes (Signed)
Procedure Name: Intubation Date/Time: 10/22/2018 10:20 AM Performed by: Lieutenant Diego, CRNA Pre-anesthesia Checklist: Patient identified, Emergency Drugs available, Suction available and Patient being monitored Patient Re-evaluated:Patient Re-evaluated prior to induction Oxygen Delivery Method: Circle system utilized Preoxygenation: Pre-oxygenation with 100% oxygen Induction Type: IV induction Ventilation: Mask ventilation without difficulty Laryngoscope Size: Miller and 2 Grade View: Grade I Tube type: Oral Tube size: 8.0 mm Number of attempts: 1 Airway Equipment and Method: Stylet and Oral airway Placement Confirmation: ETT inserted through vocal cords under direct vision,  positive ETCO2 and breath sounds checked- equal and bilateral Secured at: 22 cm Tube secured with: Tape Dental Injury: Teeth and Oropharynx as per pre-operative assessment

## 2018-10-22 NOTE — H&P (Signed)
Cc: Recurrent sinusitis, chronic nasal obstruction  HPI: The patient is a 48 year old male who returns today for his follow-up evaluation. The patient was last seen in 04/2018.  At that time, the patient was noted to have recurrent sinusitis, chronic nasal obstruction, and facial pain/pressure.  He was previously treated with multiple courses of antibiotics and steroids.  He was also treated with Azelastine and Flonase nasal spray.   He recently underwent a sinus CT scan.  The CT showed bilateral chronic maxillary and ethmoid sinus disease, and severe nasal septal deviation and bilateral inferior turbinate hypertrophy.  The patient returns today complaining of persistent nasal obstruction and facial pain/pressure.  He has not responded to his medical treatment.  He is interested in more definitive treatment of his conditions. No other ENT, GI, or respiratory issue noted since the last visit.   Exam: General: Communicates without difficulty, well nourished, no acute distress. Head: Normocephalic, no evidence injury, no tenderness, facial buttresses intact without stepoff. Eyes: PERRL, EOMI. No scleral icterus, conjunctivae clear. Neuro: CN II exam reveals vision grossly intact.  No nystagmus at any point of gaze. EAC: The TMs and EACs are noted to be normal.  No mass, erythema, or lesions. Nose: External evaluation reveals normal support and skin without lesions.  Dorsum is intact.  Anterior rhinoscopy reveals congested and edematous mucosa over anterior aspect of the inferior turbinates and nasal septum. Oral:  Oral cavity and oropharynx are intact, symmetric, without erythema or edema.  Mucosa is moist without lesions. Neck: Full range of motion without pain.  There is no significant lymphadenopathy.  No masses palpable.  Thyroid bed within normal limits to palpation.  Parotid glands and submandibular glands equal bilaterally without mass.  Trachea is midline. Neuro:  CN 2-12 grossly intact. Gait normal.  Vestibular: No nystagmus at any point of gaze.   Assessment 1.  Chronic rhinosinusitis, involving his maxillary and ethmoid sinuses.   2.  Severe septal deviation and bilateral inferior turbinate hypertrophy, causing chronic nasal obstruction.   Plan  1.  The physical exam findings and the CT images are extensively reviewed with the patient.  2.  The patient should continue with his current allergy treatment regimen of Flonase nasal spray and Azelastine nasal spray.  3.  In light of his persistent symptoms, he may benefit from surgical intervention with septoplasty, turbinate reduction and bilateral endoscopic sinus surgery to treat his maxillary and ethmoid sinuses.  The risks, benefits, alternatives and details of the procedure are reviewed with the patient.   4.  The patient would like to proceed with the procedures.

## 2018-10-22 NOTE — Discharge Instructions (Addendum)

## 2018-10-23 ENCOUNTER — Encounter (HOSPITAL_BASED_OUTPATIENT_CLINIC_OR_DEPARTMENT_OTHER): Payer: Self-pay | Admitting: Otolaryngology

## 2019-08-17 ENCOUNTER — Other Ambulatory Visit: Payer: Self-pay

## 2019-08-17 ENCOUNTER — Emergency Department (HOSPITAL_COMMUNITY)
Admission: EM | Admit: 2019-08-17 | Discharge: 2019-08-17 | Disposition: A | Discharge status: Home / Self Care | Payer: 59 | Attending: Emergency Medicine | Admitting: Emergency Medicine

## 2019-08-17 ENCOUNTER — Encounter (HOSPITAL_COMMUNITY): Payer: Self-pay | Admitting: Emergency Medicine

## 2019-08-17 DIAGNOSIS — Z87891 Personal history of nicotine dependence: Secondary | ICD-10-CM | POA: Diagnosis not present

## 2019-08-17 DIAGNOSIS — Y999 Unspecified external cause status: Secondary | ICD-10-CM | POA: Insufficient documentation

## 2019-08-17 DIAGNOSIS — W458XXA Other foreign body or object entering through skin, initial encounter: Secondary | ICD-10-CM | POA: Diagnosis not present

## 2019-08-17 DIAGNOSIS — S61041A Puncture wound with foreign body of right thumb without damage to nail, initial encounter: Secondary | ICD-10-CM | POA: Diagnosis not present

## 2019-08-17 DIAGNOSIS — Z23 Encounter for immunization: Secondary | ICD-10-CM | POA: Diagnosis not present

## 2019-08-17 DIAGNOSIS — Y929 Unspecified place or not applicable: Secondary | ICD-10-CM | POA: Diagnosis not present

## 2019-08-17 DIAGNOSIS — Y9389 Activity, other specified: Secondary | ICD-10-CM | POA: Diagnosis not present

## 2019-08-17 DIAGNOSIS — S6991XA Unspecified injury of right wrist, hand and finger(s), initial encounter: Secondary | ICD-10-CM

## 2019-08-17 MED ORDER — POVIDONE-IODINE 10 % EX SOLN
CUTANEOUS | Status: AC
  Filled 2019-08-17: qty 60

## 2019-08-17 MED ORDER — CEPHALEXIN 500 MG PO CAPS: 500.0000 mg | ORAL_CAPSULE | Freq: Four times a day (QID) | ORAL | 0 refills | Status: AC

## 2019-08-17 MED ORDER — LIDOCAINE-EPINEPHRINE (PF) 2 %-1:200000 IJ SOLN
INTRAMUSCULAR | Status: AC
  Filled 2019-08-17: qty 20

## 2019-08-17 MED ORDER — LIDOCAINE HCL (PF) 2 % IJ SOLN
10.0000 mL | Freq: Once | INTRAMUSCULAR | Status: AC
  Administered 2019-08-17: 10 mL via INTRADERMAL

## 2019-08-17 MED ORDER — LIDOCAINE HCL (PF) 2 % IJ SOLN
INTRAMUSCULAR | Status: AC
  Administered 2019-08-17: 10 mL via INTRADERMAL
  Filled 2019-08-17: qty 20

## 2019-08-17 MED ORDER — TETANUS-DIPHTH-ACELL PERTUSSIS 5-2.5-18.5 LF-MCG/0.5 IM SUSP
0.5000 mL | Freq: Once | INTRAMUSCULAR | Status: AC
  Administered 2019-08-17: 0.5 mL via INTRAMUSCULAR
  Filled 2019-08-17: qty 0.5

## 2019-08-17 NOTE — ED Triage Notes (Signed)
Fishing w son  Son caught a bass   Helping get the hook out   Fish jerked   Now with hook in hand R  Unknown DT

## 2019-08-17 NOTE — ED Notes (Signed)
Pt w.hook to R thumb, bleeding controlled, pt states he tried to remove hook himself, but unsuccessful.

## 2019-08-17 NOTE — Discharge Instructions (Signed)
Please take all of your antibiotics until finished!   Take your antibiotics with food.  Common side effects of antibiotics include nausea, vomiting, abdominal discomfort, and diarrhea. You may help offset some of this with probiotics which you can buy or get in yogurt. Do not eat  or take the probiotics until 2 hours after your antibiotic.    Some studies suggest that certain antibiotics can reduce the efficacy of certain oral contraceptive pills (birth control), so please use additional contraceptives (condoms or other barrier method) while you are taking the antibiotics and for an additional 5 to 7 days afterwards if you are a male on these medications.  You can take 1 to 2 tablets of Tylenol (350mg -1000mg  depending on the dose) every 6 hours as needed for pain.  Do not exceed 4000 mg of Tylenol daily.  If your pain persists you can take a doses of ibuprofen in between doses of Tylenol.  I usually recommend 400 to 600 mg of ibuprofen every 6 hours.  Take this with food to avoid upset stomach issues.  Keep the wound clean and dry.  Monitor for signs of infection.  Return to the emergency department if any concerning signs or symptoms develop such as fevers, severe swelling, abnormal drainage, streaking of redness up the arm, vomiting.

## 2019-08-17 NOTE — ED Provider Notes (Signed)
[facility] EMERGENCY DEPARTMENT Provider Note   CSN: [id number] Arrival date & time: 08/17/19  2028     History Chief Complaint  Patient presents with  . Hand Injury    fish hook     Adam Crawford is a 49 y.o. male with history of allergies, GERD, SVT presenting for evaluation of acute onset, persistent foreign body noted to the right thumb.  Reports that he was fishing with his son, attempting to remove a hook that was adhered to the fish when the fish jerked causing a different hook along the fishing line to pierce through his right thumb.  Reports some soreness around the site.  He attempted to remove the hook himself unsuccessfully.  He denies numbness or tingling to the digit.  Pain worsens with palpation.  He is not up-to-date on his tetanus.  He is right-hand dominant.  The history is provided by the patient.       Past Medical History:  Diagnosis Date  . Allergy   . GERD (gastroesophageal reflux disease)   . Mononucleosis    at age 89  . SVT (supraventricular tachycardia) (HCC) 05/25/2006   seen by Dr [place], no f/u or issues since.    Patient Active Problem List   Diagnosis Date Noted  . INFECTIOUS MONONUCLEOSIS, HX OF 11/17/2008    Past Surgical History:  Procedure Laterality Date  . LUMBAR MICRODISCECTOMY  July 2012  . SEPTOPLASTY WITH ETHMOIDECTOMY, AND MAXILLARY ANTROSTOMY Bilateral 10/22/2018   Procedure: ENDOSCOPIC SEPTOPLASTY WITH BILATERAL TURBINATE REDUCTION, ETHMOIDECTOMY AND MAXILLARY ANTROSTOMY;  Surgeon: Newman Pies, MD;  Location: [facility];  Service: ENT;  Laterality: Bilateral;  . SINUS ENDO WITH FUSION Bilateral 10/22/2018   Procedure: SINUS ENDOSCOPY WITH FUSION NAVIGATION;  Surgeon: Newman Pies, MD;  Location: [facility];  Service: ENT;  Laterality: Bilateral;       Family History  Problem Relation Age of Onset  . Heart attack Father 84  . Heart disease Father   . Hypertension Mother   . Diabetes Brother    . Hyperlipidemia Paternal Grandmother   . Hyperlipidemia Paternal Grandfather   . Stroke Paternal Grandfather   . Heart disease Paternal Grandfather     Social History   Tobacco Use  . Smoking status: Former Smoker    Types: Cigarettes  . Smokeless tobacco: Never Used  Substance Use Topics  . Alcohol use: Yes    Comment: occasionally  . Drug use: Never    Home Medications Prior to Admission medications   Medication Sig Start Date End Date Taking? Authorizing Provider  cephALEXin (KEFLEX) 500 MG capsule Take 1 capsule (500 mg total) by mouth 4 (four) times daily for 5 days. 08/17/19 08/22/19  Michela Pitcher A, PA-C  cetirizine (ZYRTEC) 10 MG tablet Take 10 mg by mouth daily.    Alver Fisher, RN  omeprazole (PRILOSEC) 20 MG capsule Take 20 mg by mouth daily.    Alver Fisher, RN    Allergies    Patient has no known allergies.  Review of Systems   Review of Systems  Skin: Positive for wound.  Neurological: Negative for weakness and numbness.    Physical Exam Updated Vital Signs BP (!) 161/81 (BP Location: Right Arm)   Pulse 74   Temp 98.2 F (36.8 C) (Oral)   Resp 16   Ht 6\' 1"  (1.854 m)   Wt 100.2 kg   SpO2 99%   BMI 29.16 kg/m   Physical Exam Vitals  and nursing note reviewed.  Constitutional:      General: He is not in acute distress.    Appearance: He is well-developed.  HENT:     Head: Normocephalic and atraumatic.  Eyes:     General:        Right eye: No discharge.        Left eye: No discharge.     Conjunctiva/sclera: Conjunctivae normal.  Neck:     Vascular: No JVD.     Trachea: No tracheal deviation.  Cardiovascular:     Rate and Rhythm: Normal rate and regular rhythm.  Pulmonary:     Effort: Pulmonary effort is normal.     Breath sounds: Normal breath sounds.  Abdominal:     General: There is no distension.  Musculoskeletal:        General: Normal range of motion.     Cervical back: Neck supple.     Comments: Normal range of motion of  the right thumb.  Patient has a 3 prong hook piercing through the palmar aspect of the right proximal phalanx.  Only 1 hook is involved.  Bleeding is controlled.  Some tenderness to palpation along this area.  5/5 strength of right thumb with flexion and extension against resistance.  No crepitus or deformity noted.  Skin:    General: Skin is warm and dry.     Capillary Refill: Capillary refill takes less than 2 seconds.     Findings: No erythema.  Neurological:     Mental Status: He is alert.     Comments: Sensation intact to light touch of right hand.  Psychiatric:        Behavior: Behavior normal.     ED Results / Procedures / Treatments   Labs (all labs ordered are listed, but only abnormal results are displayed) Labs Reviewed - No data to display  EKG None  Radiology No results found.  Procedures .Foreign Body Removal  Date/Time: 08/17/2019 10:49 PM Performed by: Renita Papa, PA-C Authorized by: Renita Papa, PA-C  Consent: Verbal consent obtained. Risks and benefits: risks, benefits and alternatives were discussed Consent given by: patient Patient understanding: patient states understanding of the procedure being performed Patient consent: the patient's understanding of the procedure matches consent given Procedure consent: procedure consent matches procedure scheduled Relevant documents: relevant documents present and verified Test results: test results available and properly labeled Site marked: the operative site was marked Imaging studies: imaging studies available Required items: required blood products, implants, devices, and special equipment available Patient identity confirmed: verbally with patient and arm band Time out: Immediately prior to procedure a "time out" was called to verify the correct patient, procedure, equipment, support staff and site/side marked as required. Body area: skin General location: upper extremity Location details: right  thumb Anesthesia: local infiltration  Anesthesia: Local Anesthetic: lidocaine 2% without epinephrine Localization method: visualized Removal mechanism: forceps and hemostat (Wire cutters) Tendon involvement: none Complexity: simple 1 objects recovered. Objects recovered: fish hook Post-procedure assessment: foreign body removed Patient tolerance: patient tolerated the procedure well with no immediate complications   (including critical care time)  Medications Ordered in ED Medications  Tdap (BOOSTRIX) injection 0.5 mL (0.5 mLs Intramuscular Given 08/17/19 2139)  lidocaine HCl (PF) (XYLOCAINE) 2 % injection 10 mL (10 mLs Intradermal Given 08/17/19 2145)  povidone-iodine (BETADINE) 10 % external solution (  Given 08/17/19 2141)    ED Course  I have reviewed the triage vital signs and the nursing notes.  Pertinent  labs & imaging results that were available during my care of the patient were reviewed by me and considered in my medical decision making (see chart for details).    MDM Rules/Calculators/A&P                      Patient presenting for evaluation of foreign body to the right thumb.  He has a fishhook embedded in his hand.  He is afebrile, mildly hypertensive though this could be secondary to pain.  Vital signs otherwise stable.  He is nontoxic in appearance.  He is neurovascularly intact.  His tetanus was updated in the ED.  He consented to removal of the hook.  Local anesthetic was applied and the barb of the hook was pushed through the skin and then snipped off before removal so as to minimize tissue damage.  Wound was then extensively irrigated and sterile dressing was applied.  Will discharge with course of antibiotics.  Recommend follow-up with PCP for reevaluation of symptoms.  Discussed strict ED return precautions.  Patient and wife verbalized understanding of and agreement with plan and patient is stable for discharge at this time. Final Clinical Impression(s) / ED  Diagnoses Final diagnoses:  Fish hook injury of finger of right hand, initial encounter    Rx / DC Orders ED Discharge Orders         Ordered    cephALEXin (KEFLEX) 500 MG capsule  4 times daily     08/17/19 2252           Bennye Alm 08/18/19 1614    Benjiman Core, MD 08/20/19 1445

## 2019-08-17 NOTE — ED Notes (Signed)
PA at bedside removing fish hook

## 2020-04-15 ENCOUNTER — Ambulatory Visit: Payer: 59 | Admitting: Family Medicine

## 2020-08-06 IMAGING — CT CT MAXILLOFACIAL WITHOUT CONTRAST
1 series · 15 of 30 positions shown, 19 images · non-contrast
Comparison: None.

CLINICAL DATA: Sinusitis for 1-1/2 years.

EXAM:
CT MAXILLOFACIAL WITHOUT CONTRAST
TECHNIQUE: Multidetector CT images of the paranasal sinuses were obtained using
the standard protocol without intravenous contrast.

[Series 4: soft tissue · axial · 0.40mm/px · z∈[+1014,+1162]mm · 15 of 160 slices shown, 19 images]
[im 6/160  brain]
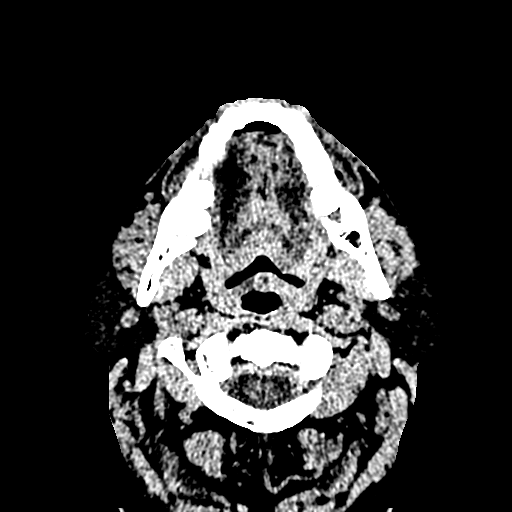
[im 6/160  bone]
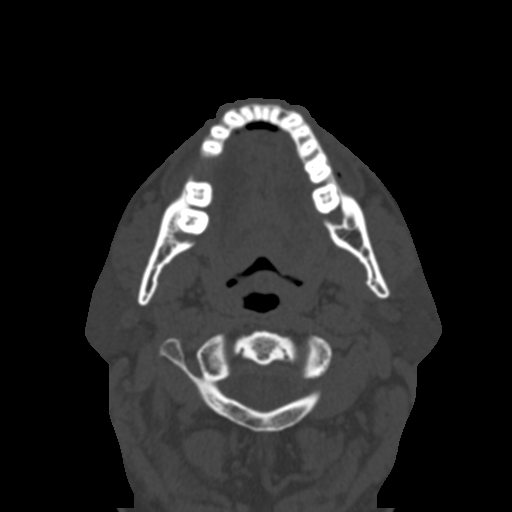
[im 17/160  bone]
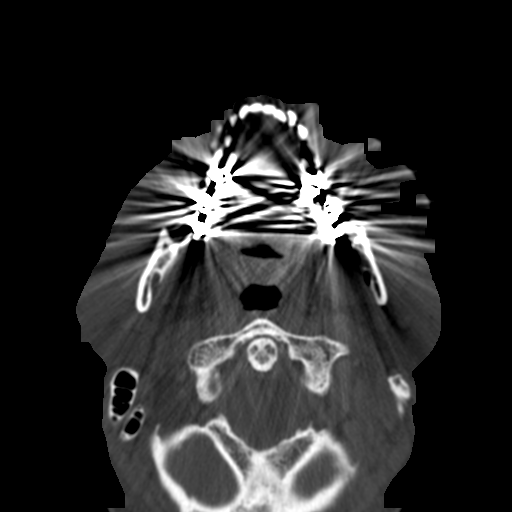
[im 28/160  bone]
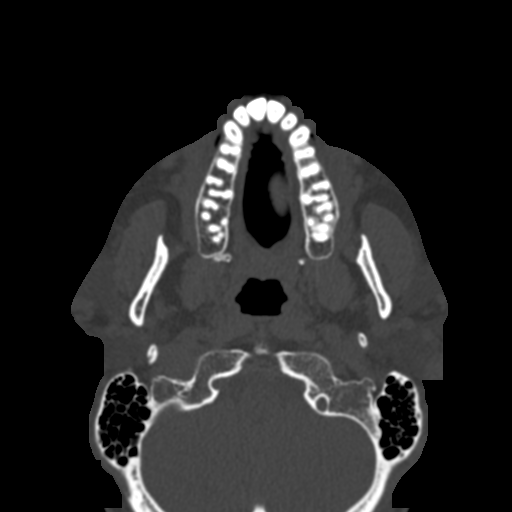
[im 39/160  bone]
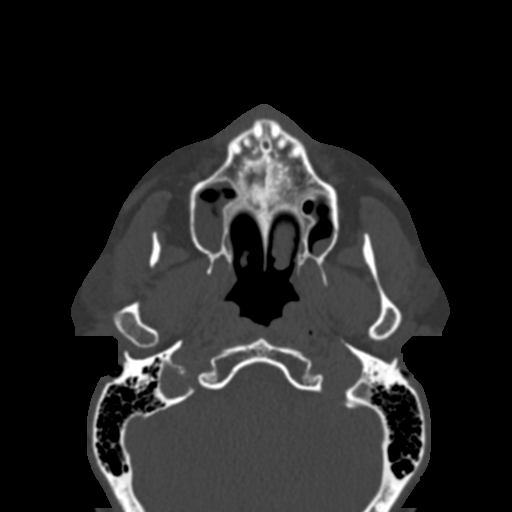
[im 50/160  brain]
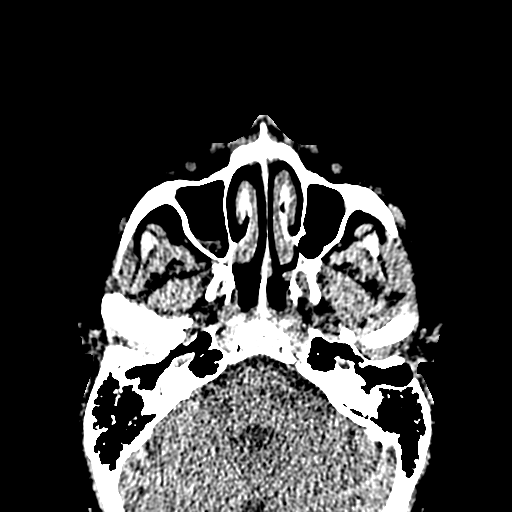
[im 50/160  bone]
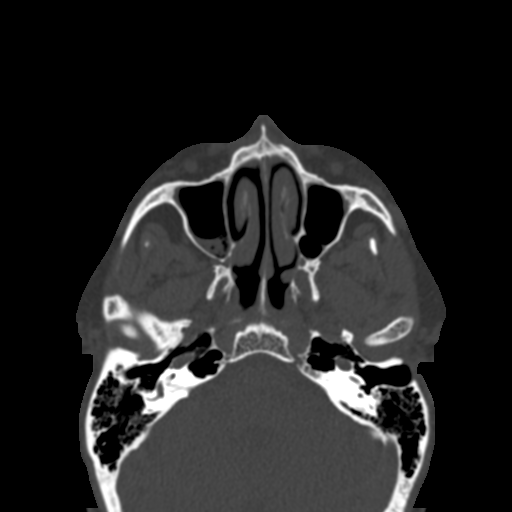
[im 61/160  bone]
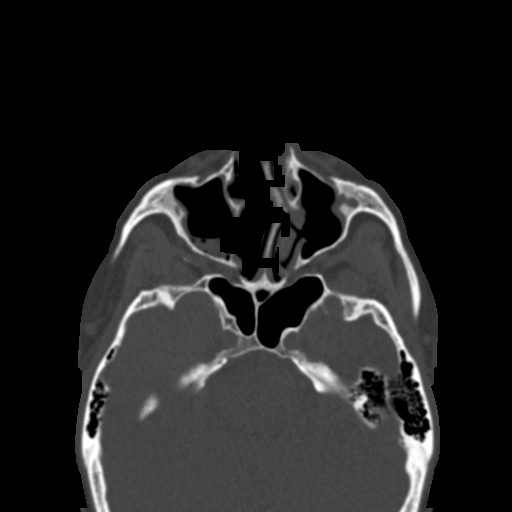
[im 72/160  bone]
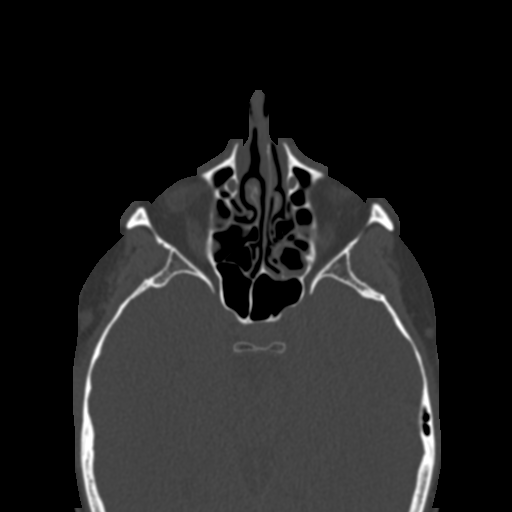
[im 83/160  bone]
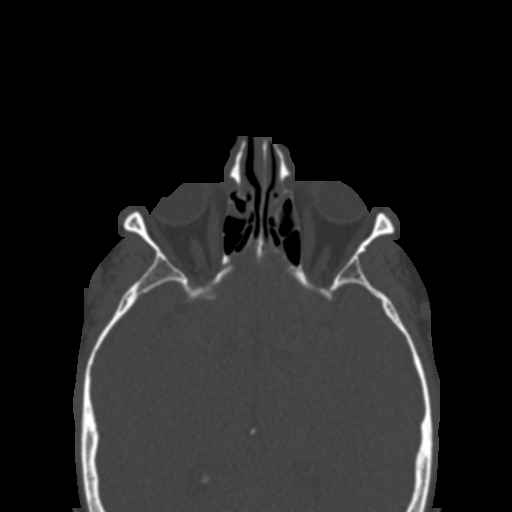
[im 88/160  brain]
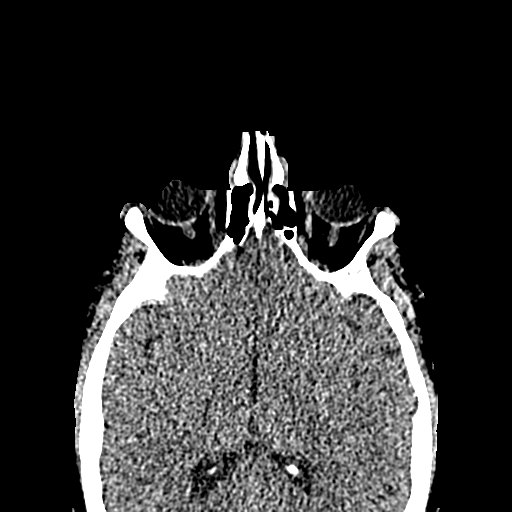
[im 88/160  bone]
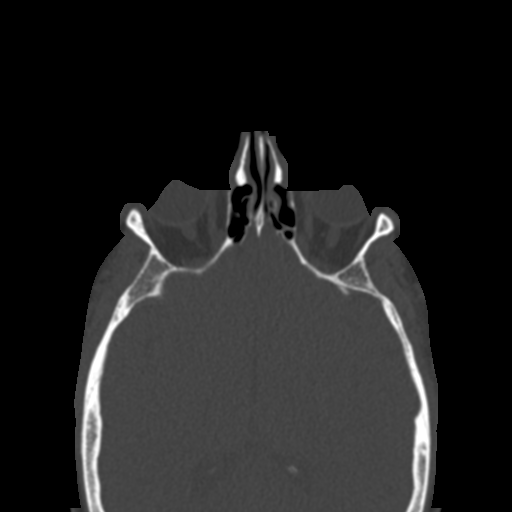
[im 99/160  bone]
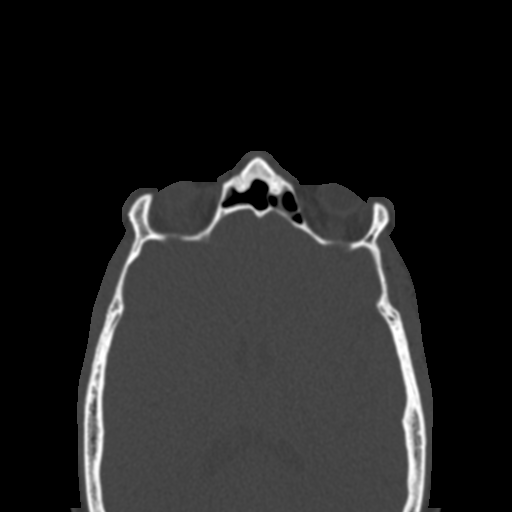
[im 110/160  bone]
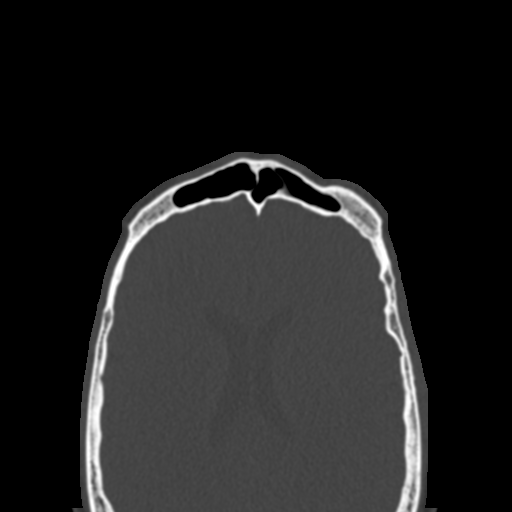
[im 121/160  bone]
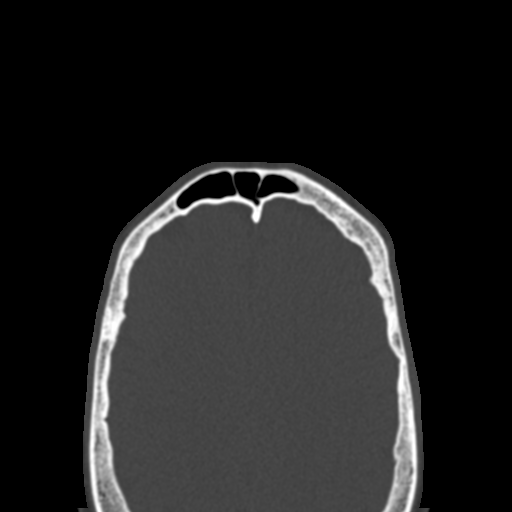
[im 132/160  brain]
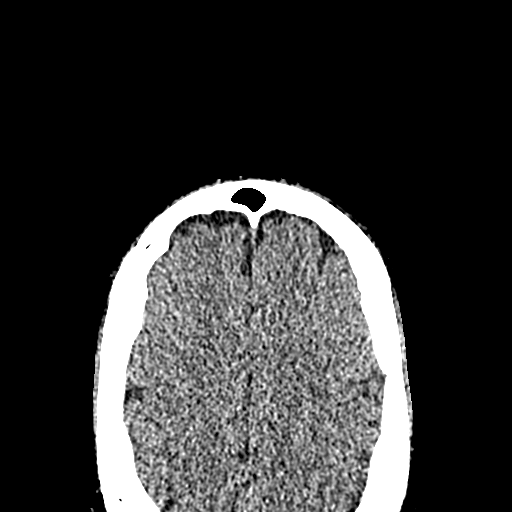
[im 132/160  bone]
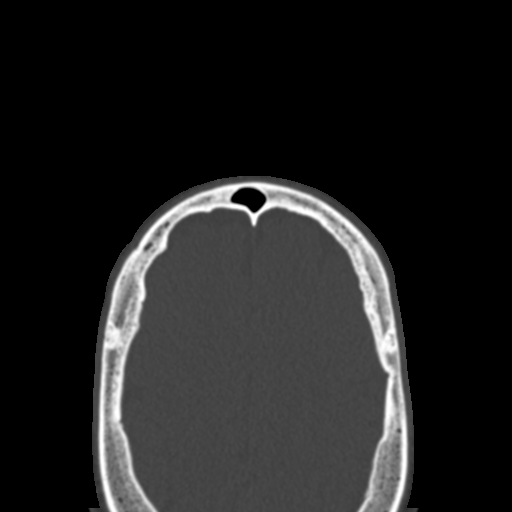
[im 143/160  bone]
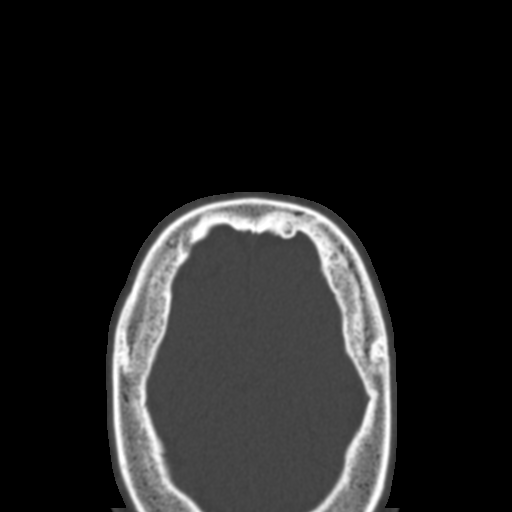
[im 154/160  bone]
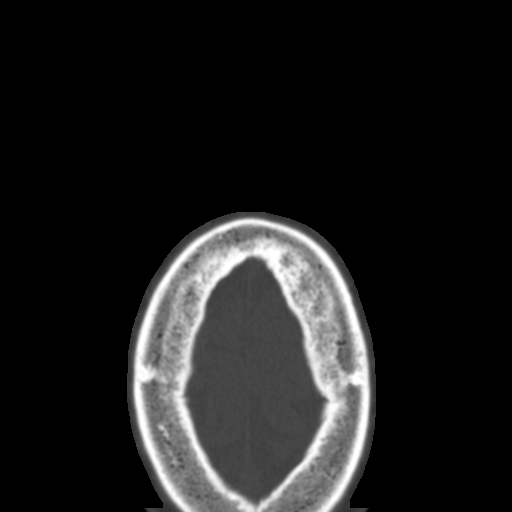

[15 of 30 positions shown; findings below may reference images not displayed]

FINDINGS: Paranasal sinuses:

Frontal: Minimal mucosal thickening is present the frontoethmoidal
recess. Frontal sinuses are otherwise clear.

Ethmoid: Minimal anterior mucosal thickening is present. Or
prominent mucosal thickening is present posteriorly on the left. No
fluid levels are present.

Maxillary: A fluid level is present the right maxillary sinus.
Chronic wall thickening and lateral mucosal thickening in the left
maxillary sinus.

Sphenoid: Normally aerated. Patent sphenoethmoidal recesses.

Right ostiomeatal unit: Narrowed without obstruction.

Left ostiomeatal unit: Narrowed without obstruction

Nasal passages: Patent. Leftward nasal septal spurring extends 9 mm
from the midline. This contacts the inferior turbinate. There is no
obstruction.

Anatomy: No pneumatization superior to anterior ethmoid notches.
Symmetric and intact olfactory grooves and fovea ethmoidalis, Keros
II (4-7mm) Sellar sphenoid pneumatization pattern. No dehiscence of
carotid or optic canals. No onodi cell.
IMPRESSION: 1. Acute on chronic right maxillary sinusitis.
2. Chronic mucosal disease of the left maxillary sinus.
3. Scattered ethmoid disease.
4. Narrowing of the ostiomeatal complex bilaterally without
stenosis.

## 2022-05-02 ENCOUNTER — Ambulatory Visit: Payer: 59 | Admitting: Family Medicine

## 2022-07-11 ENCOUNTER — Ambulatory Visit (INDEPENDENT_AMBULATORY_CARE_PROVIDER_SITE_OTHER): Payer: 59 | Admitting: Family Medicine

## 2022-07-11 ENCOUNTER — Encounter: Payer: Self-pay | Admitting: Family Medicine

## 2022-07-11 VITALS — BP 135/81 | HR 81 | Temp 97.4°F | Ht 73.0 in | Wt 220.2 lb

## 2022-07-11 DIAGNOSIS — Z0000 Encounter for general adult medical examination without abnormal findings: Secondary | ICD-10-CM | POA: Diagnosis not present

## 2022-07-11 DIAGNOSIS — I471 Supraventricular tachycardia, unspecified: Secondary | ICD-10-CM | POA: Insufficient documentation

## 2022-07-11 DIAGNOSIS — Z130 Encounter for screening for diseases of the blood and blood-forming organs and certain disorders involving the immune mechanism: Secondary | ICD-10-CM

## 2022-07-11 DIAGNOSIS — Z1159 Encounter for screening for other viral diseases: Secondary | ICD-10-CM

## 2022-07-11 DIAGNOSIS — Z13220 Encounter for screening for lipoid disorders: Secondary | ICD-10-CM

## 2022-07-11 DIAGNOSIS — Z114 Encounter for screening for human immunodeficiency virus [HIV]: Secondary | ICD-10-CM

## 2022-07-11 DIAGNOSIS — K219 Gastro-esophageal reflux disease without esophagitis: Secondary | ICD-10-CM | POA: Insufficient documentation

## 2022-07-11 DIAGNOSIS — J309 Allergic rhinitis, unspecified: Secondary | ICD-10-CM | POA: Insufficient documentation

## 2022-07-11 DIAGNOSIS — Z1211 Encounter for screening for malignant neoplasm of colon: Secondary | ICD-10-CM

## 2022-07-11 DIAGNOSIS — Z13228 Encounter for screening for other metabolic disorders: Secondary | ICD-10-CM

## 2022-07-11 DIAGNOSIS — Z6829 Body mass index (BMI) 29.0-29.9, adult: Secondary | ICD-10-CM | POA: Diagnosis not present

## 2022-07-11 NOTE — Progress Notes (Signed)
Adam Crawford is a 52 y.o. male presents to office today to establish care with new PCP and for annual physical exam examination.    Concerns today include: None  Occupation: Surveyor, minerals  Marital status: married Substance use: none Diet: generally healthy Exercise: not regular, active daily with occupation Last dental exam: regular Last colonoscopy: never Immunizations needed:  Flu Vaccine: no  Tdap Vaccine: no  Zoster Vaccine: declined   Past Medical History:  Diagnosis Date   Allergy    GERD (gastroesophageal reflux disease)    Mononucleosis    at age 53   SVT (supraventricular tachycardia) 05/25/2006   seen by Dr Odell Lions, no f/u or issues since.   Social History   Socioeconomic History   Marital status: Married    Spouse name: Verlon Au   Number of children: 2   Years of education: Not on file   Highest education level: Not on file  Occupational History   Occupation: Surveyor, minerals  Tobacco Use   Smoking status: Some Days    Types: Cigarettes   Smokeless tobacco: Never  Vaping Use   Vaping Use: Never used  Substance and Sexual Activity   Alcohol use: Yes    Comment: occasionally   Drug use: Never   Sexual activity: Yes  Other Topics Concern   Not on file  Social History Narrative   Not on file   Social Determinants of Health   Financial Resource Strain: Not on file  Food Insecurity: Not on file  Transportation Needs: Not on file  Physical Activity: Not on file  Stress: Not on file  Social Connections: Not on file  Intimate Partner Violence: Not on file   Past Surgical History:  Procedure Laterality Date   BACK SURGERY     LUMBAR MICRODISCECTOMY  10/02/2010   SEPTOPLASTY WITH ETHMOIDECTOMY, AND MAXILLARY ANTROSTOMY Bilateral 10/22/2018   Procedure: ENDOSCOPIC SEPTOPLASTY WITH BILATERAL TURBINATE REDUCTION, ETHMOIDECTOMY AND MAXILLARY ANTROSTOMY;  Surgeon: Newman Pies, MD;  Location: [facility];  Service: ENT;  Laterality: Bilateral;    SINUS ENDO WITH FUSION Bilateral 10/22/2018   Procedure: SINUS ENDOSCOPY WITH FUSION NAVIGATION;  Surgeon: Newman Pies, MD;  Location:  SURGERY CENTER;  Service: ENT;  Laterality: Bilateral;   Family History  Problem Relation Age of Onset   Hypertension Mother    Heart attack Father 73   Heart disease Father    Diabetes Brother    Hyperlipidemia Paternal Grandmother    Hyperlipidemia Paternal Grandfather    Stroke Paternal Grandfather    Heart disease Paternal Grandfather     Current Outpatient Medications:    cetirizine (ZYRTEC) 10 MG tablet, Take 10 mg by mouth daily., Disp: , Rfl:    omeprazole (PRILOSEC) 20 MG capsule, Take 20 mg by mouth daily., Disp: , Rfl:   No Known Allergies   Review of Systems Constitutional: negative for anorexia, chills, fatigue, fevers, malaise, night sweats, sweats, and weight loss Eyes: negative Ears, nose, mouth, throat, and face: positive for sinus issues, has improved greatly since surgery Respiratory: negative Cardiovascular: negative Gastrointestinal: negative Genitourinary:negative Integument/breast: negative Hematologic/lymphatic: negative Musculoskeletal:positive for arthralgias Neurological: negative Behavioral/Psych: negative Endocrine: negative Allergic/Immunologic: negative    Physical exam BP 135/81   Pulse 81   Temp (!) 97.4 F (36.3 C) (Temporal)   Ht 6\' 1"  (1.854 m)   Wt 220 lb 3.2 oz (99.9 kg)   SpO2 97%   BMI 29.05 kg/m  General appearance: alert, cooperative, appears stated age, and no distress Head: Normocephalic,  without obvious abnormality, atraumatic Eyes: conjunctivae/corneas clear. PERRL, EOM's intact. Fundi benign. Ears:  bilateral cerumen impaction Nose: Nares normal. Septum midline. Mucosa normal. No drainage or sinus tenderness. Throat: lips, mucosa, and tongue normal; teeth and gums normal Neck: no adenopathy, no carotid bruit, no JVD, supple, symmetrical, trachea midline, and thyroid not  enlarged, symmetric, no tenderness/mass/nodules Back: symmetric, no curvature. ROM normal. No CVA tenderness. Lungs: clear to auscultation bilaterally and normal percussion bilaterally Chest wall: no tenderness Heart: regular rate and rhythm, S1, S2 normal, no murmur, click, rub or gallop and normal apical impulse Abdomen: soft, non-tender; bowel sounds normal; no masses,  no organomegaly Extremities: extremities normal, atraumatic, no cyanosis or edema Pulses: 2+ and symmetric Skin: Skin color, texture, turgor normal. No rashes or lesions Lymph nodes: Cervical, supraclavicular, and axillary nodes normal. Neurologic: Alert and oriented X 3, normal strength and tone. Normal symmetric reflexes. Normal coordination and gait    Assessment/ Plan: Adam Crawford here for annual physical exam.   Adam Crawford was seen today for new patient (initial visit) and establish care.  Diagnoses and all orders for this visit:  Annual physical exam Health maintenance discussed in detail. Labs pending. Diet and exercise encouraged.  -     CMP14+EGFR; Future -     CBC with Differential/Platelet; Future -     Thyroid Panel With TSH; Future -     Lipid panel; Future  Screening for colorectal cancer -     Ambulatory referral to Gastroenterology  Screening for lipid disorders -     Lipid panel; Future  Screening for metabolic disorder -     CMP14+EGFR; Future -     Thyroid Panel With TSH; Future  Need for hepatitis C screening test -     Hepatitis C antibody; Future  Screening for deficiency anemia -     CBC with Differential/Platelet; Future  Encounter for screening for HIV -     HIV Antibody (routine testing w rflx); Future  BMI 29.0-29.9,adult Diet and exercise encouraged.  -     CMP14+EGFR; Future -     CBC with Differential/Platelet; Future -     Thyroid Panel With TSH; Future -     Lipid panel; Future    Counseled on healthy lifestyle choices, including diet (rich in fruits,  vegetables and lean meats and low in salt and simple carbohydrates) and exercise (at least 30 minutes of moderate physical activity daily).  Patient to follow up in 1 year for annual exam or sooner if needed.  The above assessment and management plan was discussed with the patient. The patient verbalized understanding of and has agreed to the management plan. Patient is aware to call the clinic if symptoms persist or worsen. Patient is aware when to return to the clinic for a follow-up visit. Patient educated on when it is appropriate to go to the emergency department.   Kari Baars, FNP-C [facility] [address] [place], [place] [address] [phone]

## 2022-07-11 NOTE — Patient Instructions (Addendum)
[employer] - [employer] - both ears nightly

## 2022-07-12 ENCOUNTER — Encounter: Payer: Self-pay | Admitting: *Deleted

## 2022-07-24 ENCOUNTER — Encounter: Payer: Self-pay | Admitting: Family Medicine

## 2022-07-24 ENCOUNTER — Telehealth (INDEPENDENT_AMBULATORY_CARE_PROVIDER_SITE_OTHER): Payer: 59 | Admitting: Family Medicine

## 2022-07-24 DIAGNOSIS — J0101 Acute recurrent maxillary sinusitis: Secondary | ICD-10-CM | POA: Diagnosis not present

## 2022-07-24 MED ORDER — AMOXICILLIN 500 MG PO CAPS: 500.0000 mg | ORAL_CAPSULE | Freq: Two times a day (BID) | ORAL | 0 refills | Status: DC

## 2022-07-24 MED ORDER — FLUTICASONE PROPIONATE 50 MCG/ACT NA SUSP: 1.0000 | Freq: Two times a day (BID) | NASAL | 6 refills | Status: AC | PRN

## 2022-07-24 NOTE — Progress Notes (Signed)
Virtual Visit via MyChart video note  I connected with Adam Crawford on 07/24/22 at 1345 by video and verified that I am speaking with the correct person using two identifiers. Adam Crawford is currently located at home and patient are currently with her during visit. The provider, Elige Radon Dajuan Turnley, MD is located in their office at time of visit.  Call ended at 1354  I discussed the limitations, risks, security and privacy concerns of performing an evaluation and management service by video and the availability of in person appointments. I also discussed with the patient that there may be a patient responsible charge related to this service. The patient expressed understanding and agreed to proceed.   History and Present Illness: Patient is calling in for sinus pressure.  He does fight seasonal allergies.  He is using navage.  He was golfing this weekend.  He started having a lot more sinus pressure on his left side of the face and under his eye.  He flushed it last night but it was more grean drainage.  He has swelling under his eye.  He denies fevers or chills or sob or wheezing or sick contacts. He had a history of sinus surgery.   1. Acute recurrent maxillary sinusitis     Outpatient Encounter Medications as of 07/24/2022  Medication Sig   amoxicillin (AMOXIL) 500 MG capsule Take 1 capsule (500 mg total) by mouth 2 (two) times daily.   fluticasone (FLONASE) 50 MCG/ACT nasal spray Place 1 spray into both nostrils 2 (two) times daily as needed for allergies or rhinitis.   cetirizine (ZYRTEC) 10 MG tablet Take 10 mg by mouth daily.   omeprazole (PRILOSEC) 20 MG capsule Take 20 mg by mouth daily.   No facility-administered encounter medications on file as of 07/24/2022.    Review of Systems  Constitutional:  Negative for chills and fever.  HENT:  Positive for congestion, postnasal drip, rhinorrhea and sinus pressure. Negative for ear discharge, ear pain, sneezing, sore  throat and voice change.   Eyes:  Negative for pain, discharge, redness and visual disturbance.  Respiratory:  Negative for cough, shortness of breath and wheezing.   Cardiovascular:  Negative for chest pain and leg swelling.  Musculoskeletal:  Negative for gait problem.  Skin:  Negative for rash.  All other systems reviewed and are negative.   Observations/Objective: Patient sounds comfortable and in no acute distress  Assessment and Plan: Problem List Items Addressed This Visit   None Visit Diagnoses     Acute recurrent maxillary sinusitis    -  Primary   Relevant Medications   amoxicillin (AMOXIL) 500 MG capsule   fluticasone (FLONASE) 50 MCG/ACT nasal spray      Will send amoxicillin and flonase.  Recommended amoxicillin to wait until he has fever.   Follow up plan: Return if symptoms worsen or fail to improve.     I discussed the assessment and treatment plan with the patient. The patient was provided an opportunity to ask questions and all were answered. The patient agreed with the plan and demonstrated an understanding of the instructions.   The patient was advised to call back or seek an in-person evaluation if the symptoms worsen or if the condition fails to improve as anticipated.  The above assessment and management plan was discussed with the patient. The patient verbalized understanding of and has agreed to the management plan. Patient is aware to call the clinic if symptoms persist or worsen. Patient is  aware when to return to the clinic for a follow-up visit. Patient educated on when it is appropriate to go to the emergency department.    I provided 9 minutes of non-face-to-face time during this encounter.    Worthy Rancher, MD

## 2022-12-18 ENCOUNTER — Telehealth (INDEPENDENT_AMBULATORY_CARE_PROVIDER_SITE_OTHER): Payer: 59 | Admitting: Family Medicine

## 2022-12-18 ENCOUNTER — Encounter: Payer: Self-pay | Admitting: Family Medicine

## 2022-12-18 DIAGNOSIS — J0101 Acute recurrent maxillary sinusitis: Secondary | ICD-10-CM | POA: Diagnosis not present

## 2022-12-18 MED ORDER — AMOXICILLIN 500 MG PO CAPS: 500.0000 mg | ORAL_CAPSULE | Freq: Two times a day (BID) | ORAL | 0 refills | Status: AC

## 2022-12-18 NOTE — Progress Notes (Signed)
Virtual Visit via MyChart video note  I connected with Adam Crawford on 12/18/22 at 1350 by video and verified that I am speaking with the correct person using two identifiers. Adam Crawford is currently located at home and patient are currently with her during visit. The provider, Elige Radon Phil Corti, MD is located in their office at time of visit.  Call ended at 1357  I discussed the limitations, risks, security and privacy concerns of performing an evaluation and management service by video and the availability of in person appointments. I also discussed with the patient that there may be a patient responsible charge related to this service. The patient expressed understanding and agreed to proceed.   History and Present Illness: Patient is calling in for sinus congestion that is worse over the past 2-3 days.  He is having it sinus pain worse on the right and behind the eyes.  He denies fevers or chills.  He denies SOB or wheezing.  He still uses claritin and the flonase. He uses navage as well and it helps.   1. Acute recurrent maxillary sinusitis     Outpatient Encounter Medications as of 12/18/2022  Medication Sig   amoxicillin (AMOXIL) 500 MG capsule Take 1 capsule (500 mg total) by mouth 2 (two) times daily.   cetirizine (ZYRTEC) 10 MG tablet Take 10 mg by mouth daily.   fluticasone (FLONASE) 50 MCG/ACT nasal spray Place 1 spray into both nostrils 2 (two) times daily as needed for allergies or rhinitis.   omeprazole (PRILOSEC) 20 MG capsule Take 20 mg by mouth daily.   [DISCONTINUED] amoxicillin (AMOXIL) 500 MG capsule Take 1 capsule (500 mg total) by mouth 2 (two) times daily.   No facility-administered encounter medications on file as of 12/18/2022.    Review of Systems  Constitutional:  Negative for chills and fever.  HENT:  Positive for congestion, postnasal drip, rhinorrhea and sinus pressure. Negative for ear discharge, ear pain, sneezing, sore throat and voice  change.   Eyes:  Negative for pain, discharge, redness and visual disturbance.  Respiratory:  Positive for cough. Negative for shortness of breath and wheezing.   Cardiovascular:  Negative for chest pain and leg swelling.  Musculoskeletal:  Negative for gait problem.  Skin:  Negative for rash.  All other systems reviewed and are negative.   Observations/Objective: Patient sounds comfortable and in no acute distress  Assessment and Plan: Problem List Items Addressed This Visit   None Visit Diagnoses     Acute recurrent maxillary sinusitis    -  Primary   Relevant Medications   amoxicillin (AMOXIL) 500 MG capsule       Patient sounds comfortable and in no acute distress  Will send patient amoxicillin to go along with his Flonase and his antihistamine that is already taking. Follow up plan: Return if symptoms worsen or fail to improve.     I discussed the assessment and treatment plan with the patient. The patient was provided an opportunity to ask questions and all were answered. The patient agreed with the plan and demonstrated an understanding of the instructions.   The patient was advised to call back or seek an in-person evaluation if the symptoms worsen or if the condition fails to improve as anticipated.  The above assessment and management plan was discussed with the patient. The patient verbalized understanding of and has agreed to the management plan. Patient is aware to call the clinic if symptoms persist or worsen. Patient is  aware when to return to the clinic for a follow-up visit. Patient educated on when it is appropriate to go to the emergency department.    I provided 7 minutes of non-face-to-face time during this encounter.    Nils Pyle, MD

## 2023-01-12 ENCOUNTER — Encounter (INDEPENDENT_AMBULATORY_CARE_PROVIDER_SITE_OTHER): Payer: Self-pay | Admitting: *Deleted

## 2023-02-19 ENCOUNTER — Encounter: Payer: Self-pay | Admitting: Nurse Practitioner

## 2023-02-19 ENCOUNTER — Ambulatory Visit (INDEPENDENT_AMBULATORY_CARE_PROVIDER_SITE_OTHER): Payer: 59 | Admitting: Nurse Practitioner

## 2023-02-19 VITALS — BP 143/86 | HR 68 | Temp 98.4°F | Ht 73.0 in | Wt 218.0 lb

## 2023-02-19 DIAGNOSIS — J0101 Acute recurrent maxillary sinusitis: Secondary | ICD-10-CM

## 2023-02-19 MED ORDER — AMOXICILLIN-POT CLAVULANATE 875-125 MG PO TABS: 1.0000 | ORAL_TABLET | Freq: Two times a day (BID) | ORAL | 0 refills | Status: AC

## 2023-02-19 NOTE — Patient Instructions (Signed)
1. Take meds as prescribed 2. Use a cool mist humidifier especially during the winter months and when heat has been humid. 3. Use saline nose sprays frequently 4. Saline irrigations of the nose can be very helpful if done frequently.  * 4X daily for 1 week*  * Use of a nettie pot can be helpful with this. Follow directions with this* 5. Drink plenty of fluids 6. Keep thermostat turn down low 7.For any cough or congestion- mucinex or sudafed 8. For fever or aces or pains- take tylenol or ibuprofen appropriate for age and weight.  * for fevers greater than 101 orally you may alternate ibuprofen and tylenol every  3 hours.

## 2023-02-19 NOTE — Progress Notes (Signed)
Subjective:    Patient ID: Adam Crawford, male    DOB: 04-Dec-1970, 52 y.o.   MRN: [id number]   Chief Complaint: Sinus Problem and Facial Pain (Pressure started Friday )   Patient in with sinus issues. Has had sinus surgery in the past  Sinus Problem This is a new problem. The current episode started in the past 7 days. The problem has been gradually improving since onset. There has been no fever. His pain is at a severity of 2/10. The pain is mild. Associated symptoms include congestion, headaches and sinus pressure. Pertinent negatives include no chills, coughing or sore throat. Treatments tried: sudafec. The treatment provided mild relief.    Patient Active Problem List   Diagnosis Date Noted   Allergic rhinitis 07/11/2022   GERD (gastroesophageal reflux disease) 07/11/2022   BMI 29.0-29.9,adult 07/11/2022   Paroxysmal SVT (supraventricular tachycardia) (HCC) 07/11/2022       Review of Systems  Constitutional:  Negative for chills and fever.  HENT:  Positive for congestion and sinus pressure. Negative for sore throat.   Respiratory:  Negative for cough.   Neurological:  Positive for headaches.       Objective:   Physical Exam Constitutional:      Appearance: Normal appearance.  HENT:     Right Ear: Tympanic membrane normal.     Nose: Congestion and rhinorrhea present.     Right Sinus: Maxillary sinus tenderness present. No frontal sinus tenderness.     Left Sinus: Maxillary sinus tenderness present. No frontal sinus tenderness.     Mouth/Throat:     Pharynx: No oropharyngeal exudate or posterior oropharyngeal erythema.  Cardiovascular:     Rate and Rhythm: Normal rate and regular rhythm.     Heart sounds: Normal heart sounds.  Pulmonary:     Effort: Pulmonary effort is normal.     Breath sounds: Normal breath sounds.  Neurological:     General: No focal deficit present.     Mental Status: He is alert and oriented to person, place, and time.   Psychiatric:        Mood and Affect: Mood normal.        Behavior: Behavior normal.    BP (!) 143/86   Pulse 68   Temp 98.4 F (36.9 C)   Ht 6\' 1"  (1.854 m)   Wt 218 lb (98.9 kg)   SpO2 98%   BMI 28.76 kg/m         Assessment & Plan:   Adam Crawford in today with chief complaint of Sinus Problem and Facial Pain (Pressure started Friday )   1. Acute recurrent maxillary sinusitis 1. Take meds as prescribed 2. Use a cool mist humidifier especially during the winter months and when heat has been humid. 3. Use saline nose sprays frequently 4. Saline irrigations of the nose can be very helpful if done frequently.  * 4X daily for 1 week*  * Use of a nettie pot can be helpful with this. Follow directions with this* 5. Drink plenty of fluids 6. Keep thermostat turn down low 7.For any cough or congestion- mucinex or sudafed 8. For fever or aces or pains- take tylenol or ibuprofen appropriate for age and weight.  * for fevers greater than 101 orally you may alternate ibuprofen and tylenol every  3 hours.   Meds ordered this encounter  Medications   amoxicillin-clavulanate (AUGMENTIN) 875-125 MG tablet    Sig: Take 1 tablet by mouth 2 (two) times daily.  Dispense:  14 tablet    Refill:  0    Order Specific Question:   Supervising Provider    Answer:   Arville Care A [[id number]]       The above assessment and management plan was discussed with the patient. The patient verbalized understanding of and has agreed to the management plan. Patient is aware to call the clinic if symptoms persist or worsen. Patient is aware when to return to the clinic for a follow-up visit. Patient educated on when it is appropriate to go to the emergency department.   Mary-Margaret Daphine Deutscher, FNP

## 2023-07-11 ENCOUNTER — Encounter: Payer: 59 | Admitting: Family Medicine
